# Patient Record
Sex: Female | Born: 1985 | Race: Black or African American | Hispanic: No | Marital: Married | Smoking: Never smoker
Health system: Southern US, Community
[De-identification: ages and names within clinical notes are randomized; demographics above are authoritative.]

## PROBLEM LIST (undated history)

## (undated) ENCOUNTER — Inpatient Hospital Stay (HOSPITAL_COMMUNITY): Payer: Self-pay

## (undated) DIAGNOSIS — E119 Type 2 diabetes mellitus without complications: Secondary | ICD-10-CM

## (undated) DIAGNOSIS — E559 Vitamin D deficiency, unspecified: Secondary | ICD-10-CM

## (undated) DIAGNOSIS — Z5189 Encounter for other specified aftercare: Secondary | ICD-10-CM

## (undated) DIAGNOSIS — R87629 Unspecified abnormal cytological findings in specimens from vagina: Secondary | ICD-10-CM

## (undated) DIAGNOSIS — K5090 Crohn's disease, unspecified, without complications: Secondary | ICD-10-CM

## (undated) HISTORY — PX: BUNIONECTOMY: SHX129

## (undated) HISTORY — DX: Encounter for other specified aftercare: Z51.89

## (undated) HISTORY — DX: Crohn's disease, unspecified, without complications: K50.90

## (undated) HISTORY — DX: Type 2 diabetes mellitus without complications: E11.9

## (undated) HISTORY — DX: Unspecified abnormal cytological findings in specimens from vagina: R87.629

## (undated) HISTORY — DX: Vitamin D deficiency, unspecified: E55.9

---
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
## 2004-04-01 ENCOUNTER — Encounter: Admission: RE | Admit: 2004-04-01 | Discharge: 2004-04-01 | Payer: Self-pay | Admitting: *Deleted

## 2005-07-12 ENCOUNTER — Inpatient Hospital Stay (HOSPITAL_COMMUNITY): Admission: AD | Admit: 2005-07-12 | Discharge: 2005-07-17 | Payer: Self-pay | Admitting: Gastroenterology

## 2005-07-12 ENCOUNTER — Encounter: Admission: RE | Admit: 2005-07-12 | Discharge: 2005-07-12 | Payer: Self-pay | Admitting: Gastroenterology

## 2007-11-25 ENCOUNTER — Encounter: Admission: RE | Admit: 2007-11-25 | Discharge: 2007-11-25 | Payer: Self-pay | Admitting: Gastroenterology

## 2008-01-26 ENCOUNTER — Emergency Department (HOSPITAL_COMMUNITY): Admission: EM | Admit: 2008-01-26 | Discharge: 2008-01-26 | Payer: Self-pay | Admitting: Emergency Medicine

## 2008-04-09 ENCOUNTER — Encounter (HOSPITAL_COMMUNITY): Admission: RE | Admit: 2008-04-09 | Discharge: 2008-04-10 | Payer: Self-pay | Admitting: Gastroenterology

## 2008-06-30 ENCOUNTER — Emergency Department (HOSPITAL_COMMUNITY): Admission: EM | Admit: 2008-06-30 | Discharge: 2008-06-30 | Payer: Self-pay | Admitting: Emergency Medicine

## 2008-07-02 ENCOUNTER — Inpatient Hospital Stay (HOSPITAL_COMMUNITY): Admission: AD | Admit: 2008-07-02 | Discharge: 2008-07-06 | Payer: Self-pay | Admitting: Gastroenterology

## 2008-07-13 ENCOUNTER — Emergency Department (HOSPITAL_COMMUNITY): Admission: EM | Admit: 2008-07-13 | Discharge: 2008-07-13 | Payer: Self-pay | Admitting: Emergency Medicine

## 2008-10-22 ENCOUNTER — Encounter: Admission: RE | Admit: 2008-10-22 | Discharge: 2008-10-22 | Payer: Self-pay | Admitting: Gastroenterology

## 2010-06-21 LAB — COMPREHENSIVE METABOLIC PANEL
Albumin: 3.2 g/dL — ABNORMAL LOW (ref 3.5–5.2)
BUN: 7 mg/dL (ref 6–23)
Calcium: 8.8 mg/dL (ref 8.4–10.5)
Creatinine, Ser: 0.68 mg/dL (ref 0.4–1.2)
Total Protein: 7.2 g/dL (ref 6.0–8.3)

## 2010-06-22 LAB — DIFFERENTIAL
Basophils Absolute: 0 10*3/uL (ref 0.0–0.1)
Basophils Absolute: 0 10*3/uL (ref 0.0–0.1)
Basophils Absolute: 0 10*3/uL (ref 0.0–0.1)
Basophils Relative: 0 % (ref 0–1)
Basophils Relative: 0 % (ref 0–1)
Basophils Relative: 0 % (ref 0–1)
Basophils Relative: 0 % (ref 0–1)
Basophils Relative: 0 % (ref 0–1)
Eosinophils Absolute: 0 10*3/uL (ref 0.0–0.7)
Eosinophils Absolute: 0 10*3/uL (ref 0.0–0.7)
Eosinophils Absolute: 0 10*3/uL (ref 0.0–0.7)
Eosinophils Absolute: 0 10*3/uL (ref 0.0–0.7)
Eosinophils Absolute: 0 10*3/uL (ref 0.0–0.7)
Eosinophils Absolute: 0 10*3/uL (ref 0.0–0.7)
Eosinophils Relative: 0 % (ref 0–5)
Eosinophils Relative: 0 % (ref 0–5)
Eosinophils Relative: 0 % (ref 0–5)
Eosinophils Relative: 0 % (ref 0–5)
Lymphocytes Relative: 12 % (ref 12–46)
Lymphocytes Relative: 16 % (ref 12–46)
Lymphocytes Relative: 25 % (ref 12–46)
Lymphs Abs: 1.3 10*3/uL (ref 0.7–4.0)
Lymphs Abs: 1.3 10*3/uL (ref 0.7–4.0)
Monocytes Absolute: 0.1 10*3/uL (ref 0.1–1.0)
Monocytes Absolute: 0.5 10*3/uL (ref 0.1–1.0)
Monocytes Absolute: 1.3 10*3/uL — ABNORMAL HIGH (ref 0.1–1.0)
Monocytes Relative: 3 % (ref 3–12)
Monocytes Relative: 5 % (ref 3–12)
Monocytes Relative: 7 % (ref 3–12)
Monocytes Relative: 8 % (ref 3–12)
Neutro Abs: 7.1 10*3/uL (ref 1.7–7.7)
Neutrophils Relative %: 63 % (ref 43–77)
Neutrophils Relative %: 78 % — ABNORMAL HIGH (ref 43–77)
Neutrophils Relative %: 82 % — ABNORMAL HIGH (ref 43–77)

## 2010-06-22 LAB — COMPREHENSIVE METABOLIC PANEL
ALT: 10 U/L (ref 0–35)
ALT: 13 U/L (ref 0–35)
AST: 19 U/L (ref 0–37)
Albumin: 2.9 g/dL — ABNORMAL LOW (ref 3.5–5.2)
Alkaline Phosphatase: 52 U/L (ref 39–117)
Alkaline Phosphatase: 63 U/L (ref 39–117)
BUN: 3 mg/dL — ABNORMAL LOW (ref 6–23)
BUN: 7 mg/dL (ref 6–23)
CO2: 20 mEq/L (ref 19–32)
CO2: 22 mEq/L (ref 19–32)
Calcium: 8.3 mg/dL — ABNORMAL LOW (ref 8.4–10.5)
Creatinine, Ser: 1.2 mg/dL (ref 0.4–1.2)
GFR calc Af Amer: >60 mL/min (ref >60)
GFR calc non Af Amer: >60 mL/min (ref >60)
Glucose, Bld: 153 mg/dL — ABNORMAL HIGH (ref 70–99)
Potassium: 3.2 mEq/L — ABNORMAL LOW (ref 3.5–5.1)
Potassium: 4.5 mEq/L (ref 3.5–5.1)
Sodium: 139 mEq/L (ref 135–145)
Total Bilirubin: 0.5 mg/dL (ref 0.3–1.2)
Total Protein: 5.6 g/dL — ABNORMAL LOW (ref 6.0–8.3)
Total Protein: 6.6 g/dL (ref 6.0–8.3)

## 2010-06-22 LAB — CBC
HCT: 31.7 % — ABNORMAL LOW (ref 36.0–46.0)
HCT: 33.1 % — ABNORMAL LOW (ref 36.0–46.0)
HCT: 33.3 % — ABNORMAL LOW (ref 36.0–46.0)
HCT: 45.5 % (ref 36.0–46.0)
Hemoglobin: 10.4 g/dL — ABNORMAL LOW (ref 12.0–15.0)
Hemoglobin: 11.1 g/dL — ABNORMAL LOW (ref 12.0–15.0)
Hemoglobin: 12.1 g/dL (ref 12.0–15.0)
MCHC: 33.2 g/dL (ref 30.0–36.0)
MCHC: 33.3 g/dL (ref 30.0–36.0)
MCV: 81.1 fL (ref 78.0–100.0)
MCV: 82.1 fL (ref 78.0–100.0)
MCV: 82.4 fL (ref 78.0–100.0)
Platelets: 218 10*3/uL (ref 150–400)
Platelets: 256 10*3/uL (ref 150–400)
Platelets: 283 10*3/uL (ref 150–400)
RBC: 3.87 MIL/uL (ref 3.87–5.11)
RBC: 4.03 MIL/uL (ref 3.87–5.11)
RBC: 4.04 MIL/uL (ref 3.87–5.11)
RBC: 4.11 MIL/uL (ref 3.87–5.11)
RDW: 20.1 % — ABNORMAL HIGH (ref 11.5–15.5)
RDW: 20.3 % — ABNORMAL HIGH (ref 11.5–15.5)
RDW: 20.5 % — ABNORMAL HIGH (ref 11.5–15.5)
RDW: 20.6 % — ABNORMAL HIGH (ref 11.5–15.5)
WBC: 10.6 10*3/uL — ABNORMAL HIGH (ref 4.0–10.5)
WBC: 10.9 10*3/uL — ABNORMAL HIGH (ref 4.0–10.5)
WBC: 6.4 10*3/uL (ref 4.0–10.5)

## 2010-06-22 LAB — BASIC METABOLIC PANEL
BUN: 6 mg/dL (ref 6–23)
BUN: 6 mg/dL (ref 6–23)
BUN: 7 mg/dL (ref 6–23)
CO2: 20 mEq/L (ref 19–32)
Calcium: 8.2 mg/dL — ABNORMAL LOW (ref 8.4–10.5)
Chloride: 115 mEq/L — ABNORMAL HIGH (ref 96–112)
Chloride: 115 mEq/L — ABNORMAL HIGH (ref 96–112)
Creatinine, Ser: 0.9 mg/dL (ref 0.4–1.2)
Creatinine, Ser: 0.9 mg/dL (ref 0.4–1.2)
GFR calc Af Amer: >60 mL/min (ref >60)
GFR calc Af Amer: >60 mL/min (ref >60)
GFR calc non Af Amer: 56 mL/min — ABNORMAL LOW (ref >60)
Glucose, Bld: 99 mg/dL (ref 70–99)
Potassium: 3.6 mEq/L (ref 3.5–5.1)
Potassium: 4 mEq/L (ref 3.5–5.1)
Sodium: 140 mEq/L (ref 135–145)

## 2010-06-22 LAB — URINALYSIS, ROUTINE W REFLEX MICROSCOPIC
Glucose, UA: NEGATIVE mg/dL
Hgb urine dipstick: NEGATIVE
Specific Gravity, Urine: 1.021 (ref 1.005–1.030)
Urobilinogen, UA: 0.2 mg/dL (ref 0.0–1.0)

## 2010-06-22 LAB — LIPASE, BLOOD
Lipase: 417 U/L — ABNORMAL HIGH (ref 11–59)
Lipase: 611 U/L — ABNORMAL HIGH (ref 11–59)

## 2010-06-22 LAB — HEMOCCULT GUIAC POC 1CARD (OFFICE): Fecal Occult Bld: POSITIVE

## 2010-06-22 LAB — AMYLASE
Amylase: 443 U/L — ABNORMAL HIGH (ref 27–131)
Amylase: 482 U/L — ABNORMAL HIGH (ref 27–131)

## 2010-06-22 LAB — URINE MICROSCOPIC-ADD ON

## 2010-06-27 LAB — CROSSMATCH: ABO/RH(D): B POS

## 2010-07-26 NOTE — Consult Note (Signed)
NAME:  Marissa Adams, ANTILLA NO.:  [id number]   MEDICAL RECORD NO.:  [id number]          PATIENT TYPE:  INP   LOCATION:  1318                         FACILITY:  [facility]   PHYSICIAN:  Chalmers Guest, M.D.     DATE OF BIRTH:  June 25, 1985   DATE OF CONSULTATION:  07/02/2008  DATE OF DISCHARGE:                                 CONSULTATION   REFERRING PHYSICIAN:  Shirley Friar, MD.   REASON FOR CONSULTATION:  I was requested by Dr. Bosie Clos to see this  patient because of a red, painful eye.   The patient is a 25 year old African American female who was admitted to  the hospital today with Crohn's disease and she complained of a painful  red eye.  The patient's past ocular history is remarkable for previous  recurrent corneal erosions previously treated in [place].  The patient  stated that this redness had occurred within the last 1 or 2 weeks and  tended to go and come away.   EXAMINATION:  The patient's visual acuity is 20/40+ right eye, 20/40+  left eye without correction.  The patient's pupils were 3.5 mm, round  and reactive.  Negative Page Spiro.  Motility was full.  External lid  exam was normal.  The slit lamp examination revealed right eye with  conjunctiva with a ciliary flush.  The conjunctiva left eye was normal.  The cornea revealed inferior epithelial defect right eye.  The anterior  chamber was deep with a +1 cell and flare right eye.  The iris was  brown.  The lens was clear both eyes.  The left eye was deep and quiet.  The lens was clear both eyes as noted.  The retina was flat with a  normal macula and normal foveal reflex.  The optic nerve was pink with a  sharp disc margin.  The vitreous was clear with no cells.   IMPRESSION:  1. Recurrent corneal erosion right eye.  2. Iridocyclitis or iritis, right eye.   RECOMMENDATIONS:  The patient will start TobraDex ointment 3 times  daily.  She will also be dilated with Cyclopentolate for at least 1  week.  The patient should follow up in my office for a followup  examination after she is discharged from the hospital.  We will continue  the TobraDex until she returns for the followup in approximately not  longer than 2 weeks.      Chalmers Guest, M.D.  Electronically Signed     RW/MEDQ  D:  07/02/2008  T:  07/02/2008  Job:  [id number]

## 2010-07-26 NOTE — H&P (Signed)
Marissa Adams, Marissa Adams             ACCOUNT NO.:  [id number]   MEDICAL RECORD NO.:  [id number]          PATIENT TYPE:  INP   LOCATION:  1318                         FACILITY:  [facility]   PHYSICIAN:  Shirley Friar, MDDATE OF BIRTH:  December 22, 1985   DATE OF ADMISSION:  07/02/2008  DATE OF DISCHARGE:                              HISTORY & PHYSICAL   ADMISSION DIAGNOSES:  1. Crohn's flare  2. Fever.  3. Diarrhea.  4. Abdominal pain.   HISTORY OF PRESENT ILLNESS:  Marissa Adams is a 25 year old black female  who was seen in the office today as an urgent work-in due to worsening  abdominal pain, fevers, chills, diarrhea over the past week in a setting  of known Crohn's ileocolitis.  She was seen in the emergency room  approximately 48 hours ago for the same problems and had a CAT scan  which showed diffuse colitis.  A C. diff toxin ordered by our office was  negative.  She reports subjective fevers and chills.  Abdominal pain is  intermittent, crampy in the lower quadrants.  She has been unable to  keep any food or liquids down due to the nausea and vomiting.  She was  switched from Asacol to Lialda last week because of compliance issues  with the Asacol. She also reports a 1-day history of right eye pain,  redness and watering and thinks it is a corneal tear that she thinks  she had in the past.  In the office today she is febrile at 102.9. Heart  rate is in 140's.   PAST MEDICAL HISTORY:  Crohn's ileocolitis.   MEDICINES:  1. Lialda 4.8 grams p.o. daily.  2. Phenergan p.r.n.  3. Oxycodone p.r.n. (recently obtained from the emergency room visit).  4. Excedrin Migraine p.r.n.   Also has past medical history of herpes.   ALLERGIES:  No known drug allergies.   FAMILY HISTORY:  Denies family history of colon cancer, mother has had  ulcers.   SOCIAL HISTORY:  Denies cigarettes, alcohol or drugs.   REVIEW OF SYSTEMS:  Negative from a GI standpoint except as stated  above.   PHYSICAL EXAM:  VITAL SIGNS:  Temperature 102.9, pulse 140, blood  pressure sitting 90/48, lying down 90/60. Heart rate in the 140's lying  down or sitting  GENERAL:  Alert, mild acute distress.  HEENT: Sclerae right eye erythema and watery. Left eye no erythema.  HEART:  Tachycardiac, regular rhythm.  LUNGS: Clear to auscultation bilaterally.  ABDOMEN:  Right lower quadrant tenderness with guarding otherwise  nontender. Soft, nondistended, positive bowel sounds  EXTREMITIES:  No edema.   IMPRESSION:  A 25 year old black female with known Crohn's disease  presenting with what sounds like a Crohn's flare.  She also still could  have pseudomembranous colitis despite a negative Clostridium difficile  toxin.  She will be admitted for supportive care, IV Flagyl, IV  steroids, and IV fluids.  Will need to obtain an ophthalmology consult  to evaluate her right eye and see if this redness is due to Crohn's  disease.      Shirley Friar, MD  Electronically Signed     VCS/MEDQ  D:  07/02/2008  T:  07/02/2008  Job:  [id number]   cc:   Duncan Dull, M.D.  Fax: [phone]

## 2010-07-26 NOTE — Discharge Summary (Signed)
NAME:  Marissa Adams, Marissa Adams NO.:  [id number]   MEDICAL RECORD NO.:  [id number]          PATIENT TYPE:  INP   LOCATION:  1318                         FACILITY:  [facility]   PHYSICIAN:  Shirley Friar, MDDATE OF BIRTH:  1985/06/14   DATE OF ADMISSION:  07/02/2008  DATE OF DISCHARGE:  07/06/2008                               DISCHARGE SUMMARY   [name] was admitted to [facility] on July 02, 2008.  She  is being discharged on July 06, 2008.   ADMIT DIAGNOSES:  1. Crohn's flare.  2. Right eye pain and redness.   DISCHARGE DIAGNOSES:  1. Crohn's flare, resolving.  2. Elevation in pancreatic enzymes asymptomatic.  3. Recurrent corneal erosion in right eye.  4. Iridocyclitis right eye.   CONSULT:  Dr. Chalmers Guest on April 22.   PERTINENT LABORATORIES:  [name] is being discharged with the  following lab result:  Hemoglobin 10.9, hematocrit 33.3, white count  10.9, platelets 256,000.  BMET remarkable for chloride of 116 and a  glucose of 124.  Amylase 443, lipase 417.  These labs were taken the  morning of April 22.   HISTORY AND BRIEF HOSPITAL COURSE:  [name] is a 25 year old female  who was seen in the [facility] as an urgent work-in  on April 22 due to worsening abdominal pain, fever, chills and diarrhea.  She has a history of Crohn's ileocolitis.  She was seen in the emergency  room on the 20th for the same problems.  She had a CT scan that showed  diffuse colitis and normal pancreas.  A C. difficile toxin was ordered  by [facility], and it was negative.  One week prior to her  admission she switched from Asacol to Lialda to improve compliance.   She also reported a 1-day history of right eye pain, redness, and watery  that she thinks might be a corneal tear because she has had it in the  past.  In the office she had a fever of 102.9 and a heart rate of 140.  Over the course of her brief hospital stay, she  improved quickly.  Her  bowel movements decreased in number.  Her abdominal pain resolved, and  she was able to tolerate clear liquids.  Further she was seen by Dr.  Harlon Flor on April 22.  He diagnosed her with corneal erosion and iritis  , and prescribed medications for these.  During the course of her  hospital stay it was noticed incidentally that her amylase and lipase  began to rise, and over the course of 4 days rose to the 400s.  However,  she was having no abdominal pain, no vomiting.  Her bowel movements were  improving and no other symptoms to suggest pancreatitis. Further she had  a normal pancreas on her CT exam done April 20.  She is going to be  discharged to home this morning in good condition.  Her only complaint  is that she is having bowel movements 3 times a day instead of 2 times a  day, but she is able to  tolerate a diet.  She has no pain.  She looked  well, and she is requesting discharge.   DISCHARGE MEDICATIONS:  1. Cyclopentolate 1 drop in her right eye 4 times a day at Dr.      Hosie Poisson recommendation.  2. Also TobraDex ointment.  Apply to right eye 3 times a day for      another 7 days also from Dr. Harlon Flor.  3. She will be discharged on Flagyl 500 mg p.o. t.i.d. to finish out      her course of antibiotics.  4. She will be discharged on prednisone 40 mg 1 tablet once a day      until she sees Dr. Bosie Clos.  5. She also has Lialda 1.2 g 4 tablets each morning, promethazine 25      mg 1 tablet q.6 hours p.r.n. nausea, and oxycodone APAP 5/325 one      tablet every 4-6 hours as needed.   DISCHARGE INSTRUCTIONS:  The patient has been requested to eat a low-  residue and low-fat diet.  She has also been requested to call [facility] if she has any increase in abdominal pain, bleeding  with her stools or recurrence of fever before she sees Dr. Bosie Clos.  She understands her instructions for her eye drops and eye ointment  medications.  She is to  call Dr. Hosie Poisson office at discharge to set  up an office appointment for 1 week from today.  She has an appointment  for labs at [facility] this coming Thursday, a CMET, CBC,  amylase, and lipase, and she has a follow-up office appointment with Dr.  Charlott Rakes on Monday, May 3.      Stephani Police, [place]      Shirley Friar, MD  Electronically Signed    MLY/MEDQ  D:  07/06/2008  T:  07/06/2008  Job:  [id number]   cc:   Chalmers Guest, M.D.  Fax: [phone]

## 2010-07-29 NOTE — H&P (Signed)
Marissa Adams, Marissa Adams NO.:  [id number]   MEDICAL RECORD NO.:  [id number]          PATIENT TYPE:  INP   LOCATION:  3035                         FACILITY:  [facility]   PHYSICIAN:  Shirley Friar, MDDATE OF BIRTH:  06/09/1985   DATE OF ADMISSION:  07/12/2005  DATE OF DISCHARGE:                                HISTORY & PHYSICAL   CHIEF COMPLAINT:  Nausea, weight loss, history of Crohn's.   HISTORY OF PRESENT ILLNESS:  [name] is a 25 year old black female  diagnosed with Crohn's ileocolitis in 2004, who has been managed on Entocort  and Pentasa. She came to see me in the office today complaining of a 2-week  history of nausea, poor appetite, weight loss and weakness. She was  hypotensive and had a low-grade fever in the office this morning and  complained of severe weakness for the past couple of weeks. She was  accompanied by her mother who brought her from her college in [place] to  bring her back here to have her evaluated. [name] was seen at an urgent  care in [place] and was found to be anemic and was instructed to return for  further evaluation but at that time her mother went to [facility] to pick her  up and brought her back here. Since her office visit she was found not to be  orthostatic but was hypotensive with blood pressure 86/64. She had stat labs  done which showed a hemoglobin of 9.7, hematocrit of 30, with a normal white  blood cell count of 6.1 thousand, platelet count was elevated at 496,000 and  a sedimentation rate was 58.  Her liver function tests were normal and her  electrolytes were otherwise normal. Her albumin was low at 3.0.  She had a  CAT scan done of her abdomen which was done with IV contrast but she refused  to drink the oral contrast. The CAT scan showed thickening of the right side  of her colon up to the transverse colon concerning for Crohn's colitis. No  obstructions were seen in her intestines and no involvement of her  small  intestine was noted. But again, this was ruled out without any oral  contrast.  After discussing these findings with her mother, her mother  states that she will not drink any liquids except for sips and feels very  fatigued. The patient also had an episode of epistaxis this afternoon and  has had intermittent epistaxis over the past several days. These episodes of  epistaxis were not mentioned during her evaluation by me this morning.  Patient reports non compliance with Pentasa and Entocort while in [place] at  the college.   PAST MEDICAL HISTORY:  History of Crohn's ileocolitis.   MEDICATIONS:  Pentasa 250 mg, Fortaz x4 daily, Entocort 9 daily (not  compliant).   ALLERGIES:  No known drug allergies.   FAMILY HISTORY:  Noncontributory.   SOCIAL HISTORY:  Currently in school in [place], otherwise negative.   REVIEW OF SYSTEMS:  Denies abdominal pain, rectal bleeding, hematochezia,  vomiting. Positive subjective fevers and chills. All other negative except  as stated in the HPI.   PHYSICAL EXAMINATION:  VITAL SIGNS:  Temperature 100.3, pulse 136, blood  pressure 86/64, weight 99 pounds (down 12 pounds since August, 2006).  GENERAL:  Thin, alert, in no acute distress.  HEENT:  Anicteric sclerae.  CHEST:  Clear to auscultation bilaterally.  CARDIOVASCULAR:  Tachycardic, regular rhythm. No murmurs.  ABDOMEN:  Thin, soft, nontender, nondistended. Active bowel sounds, no  masses palpated, no rebound and no guarding.  EXTREMITIES:  No edema.   LABORATORY DATA:  White blood cell count 6100, hemoglobin 9.7, hematocrit  30.6, platelet count 496,000, sedimentation rate 58. Glucose 98, BUN 9,  creatinine 1.2, sodium 136, potassium 4.3, chloride 103, CO2 25, calcium  8.7, total protein 7.8, albumin 3.0, total bilirubin 1.1, ALP 51, AST 37,  ALT 24.   IMPRESSION:  25 year old black female with nausea, weight loss, fatigue and  active Crohn's disease likely all secondary to Crohn's  exacerbation. Made  worse by her noncompliance with her mesalamine and Entocort. Due to  patient's failure to thrive, anemia and Crohn's colitis, I feel that short-  term hospitalization will be in her best interest in order to improve her  hydration as well as calm her Crohn's disease down. Will plan on aggressive  IV hydration, IV steroids, and change her Pentasa to Asacol which may help  her with compliance. Will put patient on a clear liquid diet and consider  advancing as tolerated. Will hold off on transfusion or blood products today  unless her blood count continues to fall, or remain at the current level.  I  stressed to patient in the office about being compliant with her Crohn's  disease medicines and this will have to be continually reinforced by her  mother as well as by her physicians. However, I strongly encouraged her to  educate herself on Crohn's disease and hopefully this episode will be short  lived so she can get back to her college work.  She may need to change to  Lialda as an outpatient instead of Asacol to further improve compliance with  her.   PLAN:  1.  Admit to a regular hospital bed.  2.  Clear liquid diet.  3.  Solu-Medrol 60 mg IV q.8 hours.  4.  Intravenous fluids of normal saline at 100 cc/Hr.  5.  Start Asacol 800 mg p.o. t.i.d.  6.  Advance diet as tolerated.   Admission plans discussed with Dr. Vida Rigger.      Shirley Friar, MD  Electronically Signed     VCS/MEDQ  D:  07/12/2005  T:  07/12/2005  Job:  [id number]

## 2010-07-29 NOTE — Discharge Summary (Signed)
NAME:  Marissa Adams, Marissa Adams             ACCOUNT NO.:  [id number]   MEDICAL RECORD NO.:  [id number]          PATIENT TYPE:  INP   LOCATION:  3035                         FACILITY:  [facility]   PHYSICIAN:  John C. Madilyn Fireman, M.D.    DATE OF BIRTH:  [birth year]   DATE OF ADMISSION:  07/12/2005  DATE OF DISCHARGE:  07/17/2005                                 DISCHARGE SUMMARY   HISTORY OF PRESENT ILLNESS:  The patient is a 25 year old, black female with  Crohn's ileocolitis who was admitted with nausea, anorexia, weight loss,  weakness with lesser degrees of abdominal cramps and diarrhea.  She was  afebrile and had a normal white blood cell count with hemoglobin of 9.7 with  elevated platelet count of 496,000, slight hypotension and a sedimentation  rate of 58.  CT scan showed thickening of the right side of her colon and  she was admitted for further therapy.  For details please see admission  History and Physical.   HOSPITAL COURSE:  The patient was begun on IV Solu-Medrol and Pentasa.  She  continued to feel weak and at some point quiet and minimally verbal  throughout the first two hospital days.  Her hemoglobin gradually dropped  down to 7.3 and it was decided to transfuse her 2 units of packed red blood  cells.  This was done on May 5.  Subsequent hemoglobin was 10.2 and on May  7, it was 10.6.  She seemed to have improved significantly after getting  blood.  On May 6, her Solu-Medrol was switched over the oral prednisone.  Her diet advanced to a low-residue diet.  On May 7, she was felt ready for  discharge.   DISCHARGE MEDICATIONS:  1.  Prednisone 40 mg a day.  2.  Pentasa 1 g t.i.d.   DISCHARGE DIAGNOSIS:  Crohn's ileocolitis.   FOLLOW UP:  Follow up with Dr. Shirley Friar in 2 weeks.           ______________________________  Everardo All Madilyn Fireman, M.D.     JCH/MEDQ  D:  07/17/2005  T:  07/17/2005  Job:  [id number]

## 2011-12-08 DIAGNOSIS — K5090 Crohn's disease, unspecified, without complications: Secondary | ICD-10-CM | POA: Insufficient documentation

## 2012-06-28 DIAGNOSIS — T380X5A Adverse effect of glucocorticoids and synthetic analogues, initial encounter: Secondary | ICD-10-CM

## 2012-06-28 DIAGNOSIS — E099 Drug or chemical induced diabetes mellitus without complications: Secondary | ICD-10-CM | POA: Insufficient documentation

## 2014-03-11 ENCOUNTER — Other Ambulatory Visit (INDEPENDENT_AMBULATORY_CARE_PROVIDER_SITE_OTHER): Payer: BC Managed Care – PPO

## 2014-03-11 ENCOUNTER — Ambulatory Visit (INDEPENDENT_AMBULATORY_CARE_PROVIDER_SITE_OTHER): Payer: BC Managed Care – PPO | Admitting: Internal Medicine

## 2014-03-11 ENCOUNTER — Encounter: Payer: Self-pay | Admitting: Internal Medicine

## 2014-03-11 VITALS — BP 118/76 | HR 71 | Temp 98.0°F | Resp 12 | Ht 64.0 in | Wt 148.0 lb

## 2014-03-11 DIAGNOSIS — K50919 Crohn's disease, unspecified, with unspecified complications: Secondary | ICD-10-CM

## 2014-03-11 DIAGNOSIS — Z299 Encounter for prophylactic measures, unspecified: Secondary | ICD-10-CM

## 2014-03-11 DIAGNOSIS — R7301 Impaired fasting glucose: Secondary | ICD-10-CM

## 2014-03-11 DIAGNOSIS — Z23 Encounter for immunization: Secondary | ICD-10-CM

## 2014-03-11 DIAGNOSIS — Z418 Encounter for other procedures for purposes other than remedying health state: Secondary | ICD-10-CM

## 2014-03-11 LAB — CBC
HCT: 42.3 % (ref 36.0–46.0)
HEMOGLOBIN: 13.7 g/dL (ref 12.0–15.0)
MCHC: 32.3 g/dL (ref 30.0–36.0)
MCV: 90.5 fl (ref 78.0–100.0)
Platelets: 280 10*3/uL (ref 150.0–400.0)
RBC: 4.68 Mil/uL (ref 3.87–5.11)
RDW: 12.9 % (ref 11.5–15.5)
WBC: 6.8 10*3/uL (ref 4.0–10.5)

## 2014-03-11 LAB — COMPREHENSIVE METABOLIC PANEL
ALBUMIN: 3.9 g/dL (ref 3.5–5.2)
ALK PHOS: 62 U/L (ref 39–117)
ALT: 9 U/L (ref 0–35)
AST: 15 U/L (ref 0–37)
BUN: 10 mg/dL (ref 6–23)
CALCIUM: 9.2 mg/dL (ref 8.4–10.5)
CO2: 26 mEq/L (ref 19–32)
CREATININE: 0.9 mg/dL (ref 0.4–1.2)
Chloride: 107 mEq/L (ref 96–112)
GFR: 94.89 mL/min (ref >60.00)
GLUCOSE: 78 mg/dL (ref 70–99)
POTASSIUM: 4.5 meq/L (ref 3.5–5.1)
Sodium: 141 mEq/L (ref 135–145)
Total Bilirubin: 1.1 mg/dL (ref 0.2–1.2)
Total Protein: 7.6 g/dL (ref 6.0–8.3)

## 2014-03-11 LAB — LIPID PANEL
CHOLESTEROL: 123 mg/dL (ref 0–200)
HDL: 50.2 mg/dL (ref >39.00)
LDL Cholesterol: 64 mg/dL (ref 0–99)
NonHDL: 72.8
TRIGLYCERIDES: 45 mg/dL (ref 0.0–149.0)
Total CHOL/HDL Ratio: 2
VLDL: 9 mg/dL (ref 0.0–40.0)

## 2014-03-11 LAB — FERRITIN: Ferritin: 28.6 ng/mL (ref 10.0–291.0)

## 2014-03-11 LAB — HEMOGLOBIN A1C: Hgb A1c MFr Bld: 4.7 % (ref 4.6–6.5)

## 2014-03-11 NOTE — Patient Instructions (Signed)
We have given you a tetanus shot today. We will check your blood work for your kidneys, liver, sugars, blood counts, iron levels.   It may be worthwhile to start taking a multivitamin everyday.  We will try to get you into the GI office upstairs so you have someone closer by to watch your crohn's disease.   Health Maintenance Adopting a healthy lifestyle and getting preventive care can go a long way to promote health and wellness. Talk with your health care provider about what schedule of regular examinations is right for you. This is a good chance for you to check in with your provider about disease prevention and staying healthy. In between checkups, there are plenty of things you can do on your own. Experts have done a lot of research about which lifestyle changes and preventive measures are most likely to keep you healthy. Ask your health care provider for more information. WEIGHT AND DIET  Eat a healthy diet  Be sure to include plenty of vegetables, fruits, low-fat dairy products, and lean protein.  Do not eat a lot of foods high in solid fats, added sugars, or salt.  Get regular exercise. This is one of the most important things you can do for your health.  Most adults should exercise for at least 150 minutes each week. The exercise should increase your heart rate and make you sweat (moderate-intensity exercise).  Most adults should also do strengthening exercises at least twice a week. This is in addition to the moderate-intensity exercise.  Maintain a healthy weight  Body mass index (BMI) is a measurement that can be used to identify possible weight problems. It estimates body fat based on height and weight. Your health care provider can help determine your BMI and help you achieve or maintain a healthy weight.  For females 51 years of age and older:   A BMI below 18.5 is considered underweight.  A BMI of 18.5 to 24.9 is normal.  A BMI of 25 to 29.9 is considered  overweight.  A BMI of 30 and above is considered obese.  Watch levels of cholesterol and blood lipids  You should start having your blood tested for lipids and cholesterol at 29 years of age, then have this test every 5 years.  You may need to have your cholesterol levels checked more often if:  Your lipid or cholesterol levels are high.  You are older than 29 years of age.  You are at high risk for heart disease.  CANCER SCREENING   Lung Cancer  Lung cancer screening is recommended for adults 45-60 years old who are at high risk for lung cancer because of a history of smoking.  A yearly low-dose CT scan of the lungs is recommended for people who:  Currently smoke.  Have quit within the past 15 years.  Have at least a 30-pack-year history of smoking. A pack year is smoking an average of one pack of cigarettes a day for 1 year.  Yearly screening should continue until it has been 15 years since you quit.  Yearly screening should stop if you develop a health problem that would prevent you from having lung cancer treatment.  Breast Cancer  Practice breast self-awareness. This means understanding how your breasts normally appear and feel.  It also means doing regular breast self-exams. Let your health care provider know about any changes, no matter how small.  If you are in your 20s or 30s, you should have a clinical breast exam (  care provider every 1-3 years as part of a regular health exam.  If you are 40 or older, have a CBE every year. Also consider having a breast X-ray (mammogram) every year.  If you have a family history of breast cancer, talk to your health care provider about genetic screening.  If you are at high risk for breast cancer, talk to your health care provider about having an MRI and a mammogram every year.  Breast cancer gene (BRCA) assessment is recommended for women who have family members with BRCA-related cancers. BRCA-related  cancers include:  Breast.  Ovarian.  Tubal.  Peritoneal cancers.  Results of the assessment will determine the need for genetic counseling and BRCA1 and BRCA2 testing. Cervical Cancer Routine pelvic examinations to screen for cervical cancer are no longer recommended for nonpregnant women who are considered low risk for cancer of the pelvic organs (ovaries, uterus, and vagina) and who do not have symptoms. A pelvic examination may be necessary if you have symptoms including those associated with pelvic infections. Ask your health care provider if a screening pelvic exam is right for you.   The Pap test is the screening test for cervical cancer for women who are considered at risk.  If you had a hysterectomy for a problem that was not cancer or a condition that could lead to cancer, then you no longer need Pap tests.  If you are older than 65 years, and you have had normal Pap tests for the past 10 years, you no longer need to have Pap tests.  If you have had past treatment for cervical cancer or a condition that could lead to cancer, you need Pap tests and screening for cancer for at least 20 years after your treatment.  If you no longer get a Pap test, assess your risk factors if they change (such as having a new sexual partner). This can affect whether you should start being screened again.  Some women have medical problems that increase their chance of getting cervical cancer. If this is the case for you, your health care provider may recommend more frequent screening and Pap tests.  The human papillomavirus (HPV) test is another test that may be used for cervical cancer screening. The HPV test looks for the virus that can cause cell changes in the cervix. The cells collected during the Pap test can be tested for HPV.  The HPV test can be used to screen women 30 years of age and older. Getting tested for HPV can extend the interval between normal Pap tests from three to five  years.  An HPV test also should be used to screen women of any age who have unclear Pap test results.  After 30 years of age, women should have HPV testing as often as Pap tests.  Colorectal Cancer  This type of cancer can be detected and often prevented.  Routine colorectal cancer screening usually begins at 29 years of age and continues through 29 years of age.  Your health care provider may recommend screening at an earlier age if you have risk factors for colon cancer.  Your health care provider may also recommend using home test kits to check for hidden blood in the stool.  A small camera at the end of a tube can be used to examine your colon directly (sigmoidoscopy or colonoscopy). This is done to check for the earliest forms of colorectal cancer.  Routine screening usually begins at age 50.  Direct examination of the   Direct examination of the colon should be repeated every 5-10 years through 29 years of age. However, you may need to be screened more often if early forms of precancerous polyps or small growths are found. Skin Cancer  Check your skin from head to toe regularly.  Tell your health care provider about any new moles or changes in moles, especially if there is a change in a mole's shape or color.  Also tell your health care provider if you have a mole that is larger than the size of a pencil eraser.  Always use sunscreen. Apply sunscreen liberally and repeatedly throughout the day.  Protect yourself by wearing long sleeves, pants, a wide-brimmed hat, and sunglasses whenever you are outside. HEART DISEASE, DIABETES, AND HIGH BLOOD PRESSURE   Have your blood pressure checked at least every 1-2 years. High blood pressure causes heart disease and increases the risk of stroke.  If you are between 55 years and 79 years old, ask your health care provider if you should take aspirin to prevent strokes.  Have regular diabetes screenings. This involves taking a blood sample to check your fasting  blood sugar level.  If you are at a normal weight and have a low risk for diabetes, have this test once every three years after 29 years of age.  If you are overweight and have a high risk for diabetes, consider being tested at a younger age or more often. PREVENTING INFECTION  Hepatitis B  If you have a higher risk for hepatitis B, you should be screened for this virus. You are considered at high risk for hepatitis B if:  You were born in a country where hepatitis B is common. Ask your health care provider which countries are considered high risk.  Your parents were born in a high-risk country, and you have not been immunized against hepatitis B (hepatitis B vaccine).  You have HIV or AIDS.  You use needles to inject street drugs.  You live with someone who has hepatitis B.  You have had sex with someone who has hepatitis B.  You get hemodialysis treatment.  You take certain medicines for conditions, including cancer, organ transplantation, and autoimmune conditions. Hepatitis C  Blood testing is recommended for:  Everyone born from 1945 through 1965.  Anyone with known risk factors for hepatitis C. Sexually transmitted infections (STIs)  You should be screened for sexually transmitted infections (STIs) including gonorrhea and chlamydia if:  You are sexually active and are younger than 29 years of age.  You are older than 29 years of age and your health care provider tells you that you are at risk for this type of infection.  Your sexual activity has changed since you were last screened and you are at an increased risk for chlamydia or gonorrhea. Ask your health care provider if you are at risk.  If you do not have HIV, but are at risk, it may be recommended that you take a prescription medicine daily to prevent HIV infection. This is called pre-exposure prophylaxis (PrEP). You are considered at risk if:  You are sexually active and do not regularly use condoms or know  the HIV status of your partner(s).  You take drugs by injection.  You are sexually active with a partner who has HIV. Talk with your health care provider about whether you are at high risk of being infected with HIV. If you choose to begin PrEP, you should first be tested for HIV. You should then be   tested every 3 months for as long as you are taking PrEP.  PREGNANCY   If you are premenopausal and you may become pregnant, ask your health care provider about preconception counseling.  If you may become pregnant, take 400 to 800 micrograms (mcg) of folic acid every day.  If you want to prevent pregnancy, talk to your health care provider about birth control (contraception). OSTEOPOROSIS AND MENOPAUSE   Osteoporosis is a disease in which the bones lose minerals and strength with aging. This can result in serious bone fractures. Your risk for osteoporosis can be identified using a bone density scan.  If you are 65 years of age or older, or if you are at risk for osteoporosis and fractures, ask your health care provider if you should be screened.  Ask your health care provider whether you should take a calcium or vitamin D supplement to lower your risk for osteoporosis.  Menopause may have certain physical symptoms and risks.  Hormone replacement therapy may reduce some of these symptoms and risks. Talk to your health care provider about whether hormone replacement therapy is right for you.  HOME CARE INSTRUCTIONS   Schedule regular health, dental, and eye exams.  Stay current with your immunizations.   Do not use any tobacco products including cigarettes, chewing tobacco, or electronic cigarettes.  If you are pregnant, do not drink alcohol.  If you are breastfeeding, limit how much and how often you drink alcohol.  Limit alcohol intake to no more than 1 drink per day for nonpregnant women. One drink equals 12 ounces of beer, 5 ounces of wine, or 1 ounces of hard liquor.  Do not  use street drugs.  Do not share needles.  Ask your health care provider for help if you need support or information about quitting drugs.  Tell your health care provider if you often feel depressed.  Tell your health care provider if you have ever been abused or do not feel safe at home. Document Released: 09/12/2010 Document Revised: 07/14/2013 Document Reviewed: 01/29/2013 [employer] Patient Information 2015 [employer]. This information is not intended to replace advice given to you by your health care provider. Make sure you discuss any questions you have with your health care provider.  

## 2014-03-11 NOTE — Progress Notes (Signed)
Pre visit review using our clinic review tool, if applicable. No additional management support is needed unless otherwise documented below in the visit note. 

## 2014-03-12 DIAGNOSIS — R7301 Impaired fasting glucose: Secondary | ICD-10-CM | POA: Insufficient documentation

## 2014-03-12 NOTE — Assessment & Plan Note (Signed)
Will refer to GI, do not prescribe immunomodulators. Will check CBC, CMP. At this time unclear if she is in remission given that last colonoscopy on current medication (decreased dosing) still showed inflammation. 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

## 2014-03-12 NOTE — Assessment & Plan Note (Signed)
Given her previous episode of diabetes with steroids would classify her as high risk for progression to diabetes. Especially sensitive to her weight. Will check HgA1c today and would follow every 6-12 months.

## 2014-03-12 NOTE — Progress Notes (Signed)
   Subjective:    Patient ID: Marissa Adams, female    DOB: 1985/09/28, 28 y.o.   MRN: [id number]  HPI The patient is a 28 YO female who comes in to establish care today. She has PMH of crohn's disease which has been difficult to control. Since starting immunotherapy she has stopped having pains. 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 She had repeat colonoscopy last year which still showed inflammation and so her immunotherapy has been increased but she has not followed up with them in some time. She has been trying to get into GI in [place]. She also had a miscarriage in February and wants to make sure that her health is okay. She does have 1 healthy child which is about 3. She denies chest pains, abdominal pains, SOB, joint pain or swelling. She is not sure if she has ever had complications from her crohn's such as fistula. She is a non-smoker. She previously has been on insulin from the prednisone for her crohn's although was able to stop once her steroids came down in dosage.   Review of Systems  Constitutional: Negative for fever, activity change, appetite change, fatigue and unexpected weight change.  HENT: Negative.   Respiratory: Negative for cough, chest tightness, shortness of breath and wheezing.   Cardiovascular: Negative for chest pain, palpitations and leg swelling.  Gastrointestinal: Positive for diarrhea and blood in stool. Negative for nausea, abdominal pain, constipation and abdominal distention.       Occasional  Genitourinary: Negative.   Musculoskeletal: Negative.   Skin: Negative.   Neurological: Negative for dizziness, weakness, light-headedness and headaches.  Psychiatric/Behavioral: Negative.       Objective:   Physical Exam  Constitutional: She is oriented to person, place, and time. She appears well-developed and well-nourished. No distress.  HENT:  Head: Normocephalic and atraumatic.    Eyes: EOM are normal.  Neck: Normal range of motion.  Cardiovascular: Normal rate and regular rhythm.   Pulmonary/Chest: Effort normal and breath sounds normal. No respiratory distress. She has no wheezes. She has no rales.  Abdominal: Soft. Bowel sounds are normal. She exhibits no distension. There is no tenderness. There is no rebound.  Musculoskeletal: She exhibits no edema or tenderness.  Neurological: She is alert and oriented to person, place, and time. Coordination normal.  Skin: Skin is warm and dry.  Psychiatric: She has a normal mood and affect. Her behavior is normal.   Filed Vitals:   03/11/14 1108  BP: 118/76  Pulse: 71  Temp: 98 F (36.7 C)  TempSrc: Oral  Resp: 12  Height: 5\' 4"  (1.626 m)  Weight: 148 lb (67.132 kg)  SpO2: 99%      Assessment & Plan:

## 2014-03-28 ENCOUNTER — Ambulatory Visit (INDEPENDENT_AMBULATORY_CARE_PROVIDER_SITE_OTHER): Payer: BLUE CROSS/BLUE SHIELD | Admitting: Physician Assistant

## 2014-03-28 VITALS — BP 102/66 | HR 84 | Temp 97.8°F | Resp 19 | Ht 64.5 in | Wt 144.6 lb

## 2014-03-28 DIAGNOSIS — K50919 Crohn's disease, unspecified, with unspecified complications: Secondary | ICD-10-CM

## 2014-03-28 DIAGNOSIS — J069 Acute upper respiratory infection, unspecified: Secondary | ICD-10-CM

## 2014-03-28 DIAGNOSIS — B9789 Other viral agents as the cause of diseases classified elsewhere: Principal | ICD-10-CM

## 2014-03-28 MED ORDER — LORATADINE-PSEUDOEPHEDRINE ER 5-120 MG PO TB12: 1.0000 | ORAL_TABLET | ORAL | Status: AC

## 2014-03-28 MED ORDER — HYDROCODONE-HOMATROPINE 5-1.5 MG/5ML PO SYRP: 5.0000 mL | ORAL_SOLUTION | Freq: Every evening | ORAL | Status: AC | PRN

## 2014-03-28 MED ORDER — ACETAMINOPHEN 500 MG PO TABS: 500.0000 mg | ORAL_TABLET | ORAL | Status: DC | PRN

## 2014-03-28 NOTE — Progress Notes (Signed)
IDENTIFYING INFORMATION  Marissa Adams / DOB: 08-13-1985 / MRN: [id number]  The patient has CD (Crohn's disease) and Impaired fasting glucose on her problem list.  SUBJECTIVE  CC: Cough; Nasal Congestion; and Headache   HPI: [name] is a well appearing 29 y.o. y.o. never smoking female presenting for cold like symptoms that started on 4 days ago with coughing and congestion. These symptoms have persisted and today she complains of a headache. Her cough is productive with yellowish phlem. She denies ear and eye symptoms. She denies hematemesis. She denies SOB and wheezing.  She denies chest pain and palpitations. She denies sore throat.   She denies a history of allergies and asthma. She has a history of Chron Disease and is medication compliant.  Her symptoms are in good control at this time.  She has been instructed by her GI to avoid NSAID therapy.    She  has a past medical history of Diabetes mellitus without complication; Blood transfusion without reported diagnosis; and Crohn's disease.    She has a current medication list which includes the following prescription(s): certolizumab pegol.  [name] has No Known Allergies. She  reports that she has never smoked. She does not have any smokeless tobacco history on file. She reports that she does not drink alcohol or use illicit drugs. She  has no sexual activity history on file.  The patient  has no past surgical history on file.  Her family history includes Diabetes in her maternal grandmother and mother; Hypertension in her maternal grandmother and paternal grandmother; Stroke in her paternal uncle.  Review of Systems  Constitutional: Positive for malaise/fatigue. Negative for fever, chills, weight loss and diaphoresis.  HENT: Positive for congestion. Negative for sore throat.   Respiratory: Positive for cough and sputum production. Negative for hemoptysis, shortness of breath and wheezing.   Cardiovascular: Negative for  chest pain and palpitations.  Gastrointestinal: Negative for nausea and abdominal pain.  Genitourinary: Negative.   Musculoskeletal: Negative for myalgias.  Skin: Negative for rash.  Neurological: Positive for headaches. Negative for dizziness and weakness.    OBJECTIVE  Blood pressure 102/66, pulse 84, temperature 97.8 F (36.6 C), temperature source Oral, resp. rate 19, height 5' 4.5" (1.638 m), weight 144 lb 9.6 oz (65.59 kg), last menstrual period 02/17/2014, SpO2 100 %. The patient's body mass index is 24.45 kg/(m^2).  Physical Exam  Constitutional: She is oriented to person, place, and time. She appears well-developed and well-nourished. No distress.  HENT:  Right Ear: Hearing, tympanic membrane, external ear and ear canal normal.  Left Ear: Hearing, tympanic membrane, external ear and ear canal normal.  Nose: Mucosal edema present. Right sinus exhibits no maxillary sinus tenderness and no frontal sinus tenderness. Left sinus exhibits no maxillary sinus tenderness and no frontal sinus tenderness.  Mouth/Throat: Uvula is midline, oropharynx is clear and moist and mucous membranes are normal.  Neck: Normal range of motion.  Cardiovascular: Normal rate, regular rhythm, normal heart sounds and intact distal pulses.   Respiratory: Effort normal and breath sounds normal. She has no wheezes. She has no rales.  GI: Soft. Bowel sounds are normal.  Musculoskeletal: Normal range of motion.  Lymphadenopathy:    She has no cervical adenopathy.  Neurological: She is alert and oriented to person, place, and time.  Skin: Skin is warm and dry. No rash noted. She is not diaphoretic. No erythema. No pallor.  Psychiatric: She has a normal mood and affect.    No  results found for this or any previous visit (from the past 24 hour(s)).  ASSESSMENT & PLAN  Marissa Adams was seen today for cough, nasal congestion and headache.  Diagnoses and associated orders for this visit:  Viral URI with  cough - HYDROcodone-homatropine (HYCODAN) 5-1.5 MG/5ML syrup; Take 5 mLs by mouth at bedtime and may repeat dose one time if needed. - loratadine-pseudoephedrine (CLARITIN-D 12 HOUR) 5-120 MG per tablet; Take 1 tablet by mouth every morning. - acetaminophen (TYLENOL) 500 MG tablet; Take 1 tablet (500 mg total) by mouth every 4 (four) hours as needed. Take 2 tabs every 8 hours.  CD (Crohn's disease), unspecified complication -     Patient's symptoms in good control at this time.  NSAIDS are to be avoided per her GI.  The remainder of the above plan was checked for medication interactions with Certolizumab Pegol [place] and zero interactions were found.       The patient was instructed to to call or comeback to clinic as needed, or should symptoms warrant.  Marissa Adams, MHS, PA-C [facility] 03/28/2014 12:20 PM

## 2014-10-15 DIAGNOSIS — K50119 Crohn's disease of large intestine with unspecified complications: Secondary | ICD-10-CM | POA: Insufficient documentation

## 2015-03-16 ENCOUNTER — Encounter: Payer: BC Managed Care – PPO | Admitting: Internal Medicine

## 2015-04-05 ENCOUNTER — Emergency Department (HOSPITAL_COMMUNITY)
Admission: EM | Admit: 2015-04-05 | Discharge: 2015-04-06 | Disposition: A | Discharge status: Home / Self Care | Payer: BLUE CROSS/BLUE SHIELD | Attending: Emergency Medicine | Admitting: Emergency Medicine

## 2015-04-05 ENCOUNTER — Encounter (HOSPITAL_COMMUNITY): Payer: Self-pay | Admitting: Emergency Medicine

## 2015-04-05 DIAGNOSIS — R000 Tachycardia, unspecified: Secondary | ICD-10-CM | POA: Insufficient documentation

## 2015-04-05 DIAGNOSIS — E119 Type 2 diabetes mellitus without complications: Secondary | ICD-10-CM | POA: Insufficient documentation

## 2015-04-05 DIAGNOSIS — R531 Weakness: Secondary | ICD-10-CM | POA: Insufficient documentation

## 2015-04-05 DIAGNOSIS — Z3202 Encounter for pregnancy test, result negative: Secondary | ICD-10-CM | POA: Diagnosis not present

## 2015-04-05 DIAGNOSIS — Z79899 Other long term (current) drug therapy: Secondary | ICD-10-CM | POA: Insufficient documentation

## 2015-04-05 DIAGNOSIS — K529 Noninfective gastroenteritis and colitis, unspecified: Secondary | ICD-10-CM | POA: Diagnosis not present

## 2015-04-05 DIAGNOSIS — R5383 Other fatigue: Secondary | ICD-10-CM | POA: Diagnosis not present

## 2015-04-05 DIAGNOSIS — R197 Diarrhea, unspecified: Secondary | ICD-10-CM | POA: Diagnosis present

## 2015-04-05 LAB — URINALYSIS, ROUTINE W REFLEX MICROSCOPIC
BILIRUBIN URINE: NEGATIVE
GLUCOSE, UA: NEGATIVE mg/dL
HGB URINE DIPSTICK: NEGATIVE
KETONES UR: 15 mg/dL — AB
Nitrite: NEGATIVE
PH: 5 (ref 5.0–8.0)
PROTEIN: NEGATIVE mg/dL
Specific Gravity, Urine: 1.019 (ref 1.005–1.030)

## 2015-04-05 LAB — COMPREHENSIVE METABOLIC PANEL
ALBUMIN: 3.4 g/dL — AB (ref 3.5–5.0)
ALT: 13 U/L — AB (ref 14–54)
AST: 25 U/L (ref 15–41)
Alkaline Phosphatase: 54 U/L (ref 38–126)
Anion gap: 9 (ref 5–15)
BILIRUBIN TOTAL: 0.8 mg/dL (ref 0.3–1.2)
CHLORIDE: 102 mmol/L (ref 101–111)
CO2: 25 mmol/L (ref 22–32)
CREATININE: 1.16 mg/dL — AB (ref 0.44–1.00)
Calcium: 8.7 mg/dL — ABNORMAL LOW (ref 8.9–10.3)
GFR calc Af Amer: >60 mL/min (ref >60)
GLUCOSE: 133 mg/dL — AB (ref 65–99)
POTASSIUM: 3.9 mmol/L (ref 3.5–5.1)
Sodium: 136 mmol/L (ref 135–145)
Total Protein: 8.1 g/dL (ref 6.5–8.1)

## 2015-04-05 LAB — CBC
HEMATOCRIT: 41.3 % (ref 36.0–46.0)
Hemoglobin: 13.7 g/dL (ref 12.0–15.0)
MCH: 27.8 pg (ref 26.0–34.0)
MCHC: 33.2 g/dL (ref 30.0–36.0)
MCV: 83.9 fL (ref 78.0–100.0)
PLATELETS: 257 10*3/uL (ref 150–400)
RBC: 4.92 MIL/uL (ref 3.87–5.11)
RDW: 14 % (ref 11.5–15.5)
WBC: 6.4 10*3/uL (ref 4.0–10.5)

## 2015-04-05 LAB — URINE MICROSCOPIC-ADD ON
Bacteria, UA: NONE SEEN
RBC / HPF: NONE SEEN RBC/hpf (ref 0–5)

## 2015-04-05 LAB — I-STAT BETA HCG BLOOD, ED (MC, WL, AP ONLY): I-stat hCG, quantitative: <5 m[IU]/mL (ref <5)

## 2015-04-05 LAB — LIPASE, BLOOD: LIPASE: 30 U/L (ref 11–51)

## 2015-04-05 MED ORDER — PROMETHAZINE HCL 25 MG/ML IJ SOLN
25.0000 mg | Freq: Once | INTRAMUSCULAR | Status: AC
  Administered 2015-04-06: 25 mg via INTRAVENOUS
  Filled 2015-04-05: qty 1

## 2015-04-05 MED ORDER — SODIUM CHLORIDE 0.9 % IV BOLUS (SEPSIS)
1000.0000 mL | Freq: Once | INTRAVENOUS | Status: AC
  Administered 2015-04-06: 1000 mL via INTRAVENOUS

## 2015-04-05 MED ORDER — ACETAMINOPHEN 325 MG PO TABS
ORAL_TABLET | ORAL | Status: AC
  Filled 2015-04-05: qty 2

## 2015-04-05 MED ORDER — ACETAMINOPHEN 325 MG PO TABS
650.0000 mg | ORAL_TABLET | Freq: Once | ORAL | Status: AC
  Administered 2015-04-05: 650 mg via ORAL

## 2015-04-05 NOTE — ED Notes (Signed)
Pt. reports emesis with diarrhea , chills , fever , productive cough and generalized body aches onset 4 days ago.

## 2015-04-05 NOTE — ED Provider Notes (Signed)
CSN: [id number]     Arrival date & time 04/05/15  1910 History   First MD Initiated Contact with Patient 04/05/15 2324     Chief Complaint  Patient presents with  . Emesis  . Diarrhea     (Consider location/radiation/quality/duration/timing/severity/associated sxs/prior Treatment) HPI Marissa Adams is a 30 year old female with past medical history of crohn's disease who presents to the ED complaining of emesis and diarrhea. Patient said the symptoms began 4 days ago and is not associated with abdominal pain. She denies blood in stool or vomit. She endorses nonproductive cough x1 day with post tussive emesis, anorexia, fatigue, night sweats, chills. Tylenol resolved HA, dizziness and lightheadedness. Denies SOB, fever. Past Medical History  Diagnosis Date  . Diabetes mellitus without complication (HCC)   . Blood transfusion without reported diagnosis   . Crohn's disease (HCC)    History reviewed. No pertinent past surgical history. Family History  Problem Relation Age of Onset  . Diabetes Mother   . Stroke Paternal Uncle   . Hypertension Maternal Grandmother   . Diabetes Maternal Grandmother   . Hypertension Paternal Grandmother    Social History  Substance Use Topics  . Smoking status: Never Smoker   . Smokeless tobacco: None  . Alcohol Use: No   OB History    No data available     Review of Systems  Constitutional: Positive for chills, diaphoresis, appetite change and fatigue. Negative for fever.  HENT: Negative for congestion.   Respiratory: Positive for cough. Negative for chest tightness, shortness of breath and wheezing.   Cardiovascular: Negative for chest pain and palpitations.  Gastrointestinal: Positive for nausea, vomiting and diarrhea. Negative for abdominal pain, constipation, blood in stool and abdominal distention.  Genitourinary: Negative for dysuria and difficulty urinating.  Neurological: Positive for weakness. Negative for dizziness, syncope,  light-headedness and headaches.  Hematological: Negative for adenopathy.      Allergies  Review of patient's allergies indicates no known allergies.  Home Medications   Prior to Admission medications   Medication Sig Start Date End Date Taking? Authorizing Provider  acetaminophen (TYLENOL) 500 MG tablet Take 1 tablet (500 mg total) by mouth every 4 (four) hours as needed. Take 2 tabs every 8 hours. 03/28/14   Ofilia Neas, PA-C  CERTOLIZUMAB PEGOL [place] Inject 400 mg into the skin every 14 (fourteen) days. 1 11/23/09   Historical Provider, MD   BP 115/68 mmHg  Pulse 96  Temp(Src) 98.3 F (36.8 C) (Oral)  Resp 17  SpO2 100%  LMP 03/08/2015 Physical Exam  Constitutional: She is oriented to person, place, and time. She appears well-developed and well-nourished. No distress.  HENT:  Head: Normocephalic and atraumatic.  Mouth/Throat: Oropharynx is clear and moist.  Difficult to visualize posterior oropharynx. Pale oral mucosa  Eyes: Pupils are equal, round, and reactive to light.  Neck: Normal range of motion. Neck supple.  Cardiovascular: Regular rhythm, normal heart sounds and intact distal pulses.  Tachycardia present.  Exam reveals no gallop and no friction rub.   No murmur heard. Pulmonary/Chest: Effort normal and breath sounds normal. No respiratory distress.  Abdominal: Soft. Bowel sounds are normal. She exhibits no distension. There is no tenderness. There is no rebound and no guarding.  Hyperdynamic bowel sounds in lower quadrants  Neurological: She is alert and oriented to person, place, and time. She exhibits normal muscle tone. Coordination normal.  Skin: Skin is warm and dry. She is not diaphoretic.  Psychiatric: She has a normal mood  and affect. Her behavior is normal.  Nursing note and vitals reviewed.   ED Course  Procedures (including critical care time) Labs Review Labs Reviewed  COMPREHENSIVE METABOLIC PANEL - Abnormal; Notable for the following:    Glucose,  Bld 133 (*)    BUN <5 (*)    Creatinine, Ser 1.16 (*)    Calcium 8.7 (*)    Albumin 3.4 (*)    ALT 13 (*)    All other components within normal limits  URINALYSIS, ROUTINE W REFLEX MICROSCOPIC (NOT AT [facility]) - Abnormal; Notable for the following:    Ketones, ur 15 (*)    Leukocytes, UA TRACE (*)    All other components within normal limits  URINE MICROSCOPIC-ADD ON - Abnormal; Notable for the following:    Squamous Epithelial / LPF 0-5 (*)    Casts GRANULAR CAST (*)    All other components within normal limits  LIPASE, BLOOD  CBC  I-STAT BETA HCG BLOOD, ED ([facility], [facility], [facility] ONLY)    Imaging Review No results found. I have personally reviewed and evaluated these images and lab results as part of my medical decision-making.   EKG Interpretation None      Patient is receiving IV fluids and antiemetics.  Patient is feeling better at this time.  Advised her to follow-up with her GI specialist.  Patient agrees to the plan and all questions were answered.  Patient most likely has a gastroenteritis based on her history and physical exam findings.  Patient is advised follow-up with her GI doctor  Charlestine Night, PA-C 04/06/15 1610  Vanetta Mulders, MD 04/06/15 [phone]

## 2015-04-06 MED ORDER — SODIUM CHLORIDE 0.9 % IV BOLUS (SEPSIS)
1000.0000 mL | Freq: Once | INTRAVENOUS | Status: AC
  Administered 2015-04-06: 1000 mL via INTRAVENOUS

## 2015-04-06 MED ORDER — PROMETHAZINE HCL 25 MG PO TABS: 25.0000 mg | ORAL_TABLET | Freq: Four times a day (QID) | ORAL | Status: DC | PRN

## 2015-04-06 NOTE — Discharge Instructions (Signed)
Return here as needed.  Follow-up with your GI doctor and primary care.  Slowly increase her fluid intake, rest as much as possible

## 2015-09-24 ENCOUNTER — Emergency Department (HOSPITAL_COMMUNITY)
Admission: EM | Admit: 2015-09-24 | Discharge: 2015-09-25 | Disposition: A | Discharge status: Home / Self Care | Payer: BLUE CROSS/BLUE SHIELD | Attending: Emergency Medicine | Admitting: Emergency Medicine

## 2015-09-24 ENCOUNTER — Encounter (HOSPITAL_COMMUNITY): Payer: Self-pay | Admitting: *Deleted

## 2015-09-24 DIAGNOSIS — Z79899 Other long term (current) drug therapy: Secondary | ICD-10-CM | POA: Diagnosis not present

## 2015-09-24 DIAGNOSIS — Z3A01 Less than 8 weeks gestation of pregnancy: Secondary | ICD-10-CM | POA: Diagnosis not present

## 2015-09-24 DIAGNOSIS — O99611 Diseases of the digestive system complicating pregnancy, first trimester: Secondary | ICD-10-CM | POA: Insufficient documentation

## 2015-09-24 DIAGNOSIS — E119 Type 2 diabetes mellitus without complications: Secondary | ICD-10-CM | POA: Insufficient documentation

## 2015-09-24 DIAGNOSIS — K50911 Crohn's disease, unspecified, with rectal bleeding: Secondary | ICD-10-CM | POA: Diagnosis not present

## 2015-09-24 DIAGNOSIS — O26891 Other specified pregnancy related conditions, first trimester: Secondary | ICD-10-CM | POA: Diagnosis present

## 2015-09-24 LAB — POC URINE PREG, ED: Preg Test, Ur: POSITIVE — AB

## 2015-09-24 LAB — COMPREHENSIVE METABOLIC PANEL
ALBUMIN: 2.9 g/dL — AB (ref 3.5–5.0)
ALT: 28 U/L (ref 14–54)
AST: 20 U/L (ref 15–41)
Alkaline Phosphatase: 70 U/L (ref 38–126)
Anion gap: 6 (ref 5–15)
BILIRUBIN TOTAL: 0.6 mg/dL (ref 0.3–1.2)
CO2: 23 mmol/L (ref 22–32)
Calcium: 8.7 mg/dL — ABNORMAL LOW (ref 8.9–10.3)
Chloride: 105 mmol/L (ref 101–111)
Creatinine, Ser: 0.87 mg/dL (ref 0.44–1.00)
Glucose, Bld: 96 mg/dL (ref 65–99)
POTASSIUM: 4.2 mmol/L (ref 3.5–5.1)
Sodium: 134 mmol/L — ABNORMAL LOW (ref 135–145)
TOTAL PROTEIN: 7.4 g/dL (ref 6.5–8.1)

## 2015-09-24 LAB — URINALYSIS, ROUTINE W REFLEX MICROSCOPIC
Bilirubin Urine: NEGATIVE
GLUCOSE, UA: NEGATIVE mg/dL
Hgb urine dipstick: NEGATIVE
Ketones, ur: NEGATIVE mg/dL
Nitrite: NEGATIVE
PH: 6 (ref 5.0–8.0)
Protein, ur: NEGATIVE mg/dL
Specific Gravity, Urine: 1.017 (ref 1.005–1.030)

## 2015-09-24 LAB — CBC
HEMATOCRIT: 37.9 % (ref 36.0–46.0)
Hemoglobin: 12.6 g/dL (ref 12.0–15.0)
MCH: 28.4 pg (ref 26.0–34.0)
MCHC: 33.2 g/dL (ref 30.0–36.0)
MCV: 85.4 fL (ref 78.0–100.0)
Platelets: 301 10*3/uL (ref 150–400)
RBC: 4.44 MIL/uL (ref 3.87–5.11)
RDW: 13.9 % (ref 11.5–15.5)
WBC: 9.9 10*3/uL (ref 4.0–10.5)

## 2015-09-24 LAB — URINE MICROSCOPIC-ADD ON

## 2015-09-24 LAB — LIPASE, BLOOD: Lipase: 23 U/L (ref 11–51)

## 2015-09-24 NOTE — ED Notes (Signed)
Called for Pt to go to RM. No Answer

## 2015-09-24 NOTE — ED Provider Notes (Signed)
History  By signing my name below, I, Marissa Adams, attest that this documentation has been prepared under the direction and in the presence of Tomasita Crumble, MD. Electronically Signed: Earmon Adams, ED Scribe. 09/25/2015. 12:41 AM.  Chief Complaint  Patient presents with  . Abdominal Pain   The history is provided by the patient and medical records. No language interpreter was used.    HPI Comments:  Marissa Adams is a 30 y.o. female with PMHx of DM and Crohn's disease who presents to the Emergency Department complaining of nausea, vomiting and hematochezia that began about one week ago. She reports the blood in her stool is bright red and has been having abdominal pain. She states she contacted her GI doctor about the symptoms but has not heard back from him. She states this feels similar to her typical Crohn's flare ups. Pt reports she is currently on Prednisone for the Crohn's. She states she was on Humira but stopped that at the end of May. She states she had an abdominal MRI last month. She denies taking anything for her current symptoms besides the prescribed Prednisone. She denies modifying factors. She denies any dysuria, hematuria, fever, chills. Pt reports her LMP was 07/26/15.   Past Medical History  Diagnosis Date  . Diabetes mellitus without complication (HCC)   . Blood transfusion without reported diagnosis   . Crohn's disease (HCC)    History reviewed. No pertinent past surgical history. Family History  Problem Relation Age of Onset  . Diabetes Mother   . Stroke Paternal Uncle   . Hypertension Maternal Grandmother   . Diabetes Maternal Grandmother   . Hypertension Paternal Grandmother    Social History  Substance Use Topics  . Smoking status: Never Smoker   . Smokeless tobacco: None  . Alcohol Use: No   OB History    No data available     Review of Systems A complete 10 system review of systems was obtained and all systems are negative except as noted  in the HPI and PMH.   Allergies  Contrast media  Home Medications   Prior to Admission medications   Medication Sig Start Date End Date Taking? Authorizing Provider  acetaminophen (TYLENOL) 500 MG tablet Take 1 tablet (500 mg total) by mouth every 4 (four) hours as needed. Take 2 tabs every 8 hours. 03/28/14   Ofilia Neas, PA-C  CERTOLIZUMAB PEGOL [place] Inject 400 mg into the skin every 14 (fourteen) days. 1 11/23/09   Historical Provider, MD  promethazine (PHENERGAN) 25 MG tablet Take 1 tablet (25 mg total) by mouth every 6 (six) hours as needed for nausea or vomiting. 04/06/15   Charlestine Night, PA-C   Triage Vitals: BP 127/83 mmHg  Pulse 118  Temp(Src) 99.1 F (37.3 C) (Oral)  Resp 18  Ht  (1.626 m)  Wt 165 lb (74.844 kg)  BMI 28.31 kg/m2  SpO2 100%  LMP 08/25/2015 Physical Exam  Constitutional: She is oriented to person, place, and time. She appears well-developed and well-nourished. No distress.  HENT:  Head: Normocephalic and atraumatic.  Nose: Nose normal.  Mouth/Throat: Oropharynx is clear and moist. No oropharyngeal exudate.  Eyes: Conjunctivae and EOM are normal. Pupils are equal, round, and reactive to light. No scleral icterus.  Neck: Normal range of motion. Neck supple. No JVD present. No tracheal deviation present. No thyromegaly present.  Cardiovascular: Regular rhythm and normal heart sounds.  Tachycardia present.  Exam reveals no gallop and no friction rub.  No murmur heard. Pulmonary/Chest: Effort normal and breath sounds normal. No respiratory distress. She has no wheezes. She exhibits no tenderness.  Abdominal: Soft. Bowel sounds are normal. She exhibits no distension and no mass. There is no tenderness. There is no rebound and no guarding.  Musculoskeletal: Normal range of motion. She exhibits no edema or tenderness.  Lymphadenopathy:    She has no cervical adenopathy.  Neurological: She is alert and oriented to person, place, and time. No cranial  nerve deficit. She exhibits normal muscle tone.  Skin: Skin is warm and dry. No rash noted. No erythema. No pallor.  Nursing note and vitals reviewed.   ED Course  Procedures (including critical care time) DIAGNOSTIC STUDIES: Oxygen Saturation is 100% on RA, normal by my interpretation.   COORDINATION OF CARE: 12:22 AM- Will order IV fluids, Zofran and antibiotics prior to discharge. Informed pt of positive pregnancy test and advised pt to stop taking Prednisone since it is not working anyway. Advised pt to follow up with OB and GI doctors. Pt verbalizes understanding and agrees to plan.  Medications  sodium chloride 0.9 % bolus 1,000 mL (not administered)  ondansetron (ZOFRAN) injection 4 mg (not administered)  metroNIDAZOLE (FLAGYL) tablet 500 mg (not administered)    Labs Review Labs Reviewed  COMPREHENSIVE METABOLIC PANEL - Abnormal; Notable for the following:    Sodium 134 (*)    BUN <5 (*)    Calcium 8.7 (*)    Albumin 2.9 (*)    All other components within normal limits  URINALYSIS, ROUTINE W REFLEX MICROSCOPIC (NOT AT [facility]) - Abnormal; Notable for the following:    Color, Urine AMBER (*)    Leukocytes, UA SMALL (*)    All other components within normal limits  URINE MICROSCOPIC-ADD ON - Abnormal; Notable for the following:    Squamous Epithelial / LPF 6-30 (*)    Bacteria, UA FEW (*)    All other components within normal limits  POC URINE PREG, ED - Abnormal; Notable for the following:    Preg Test, Ur POSITIVE (*)    All other components within normal limits  LIPASE, BLOOD  CBC    Imaging Review No results found. I have personally reviewed and evaluated these images and lab results as part of my medical decision-making.   EKG Interpretation None      MDM   Final diagnoses:  None     Patient presents to the ED for Crohns flare.  She is currently taking low dose prednisone as part of a 1 month taper and was advised to stop for adverse problems with the  fetus.  She was informed of pregnancy which she did not know prior.  She was given IVF and zofran.  Her nausea may be due to pregnancy and not Crohns, however she also states this feels typical of her Crohns.  Will Dc with flagyl for Gi bleeding in case this is an infectious component of her Crohns.  Her abdomen is nontender, no indication for CT scan.  OB and GI follow up advised within the next week.  She appears well and in NAD.  VS remain within her normal limits and she is safe for DC.  I personally performed the services described in this documentation, which was scribed in my presence. The recorded information has been reviewed and is accurate.      Tomasita Crumble, MD 09/25/15 [phone]

## 2015-09-24 NOTE — ED Notes (Signed)
The pt is c/o abd pain since Saturday with nausea she has bloody diarrhea but that goes along with her crohns disease symptoms.  lmp  june

## 2015-09-25 MED ORDER — METRONIDAZOLE 500 MG PO TABS
500.0000 mg | ORAL_TABLET | Freq: Once | ORAL | Status: AC
  Administered 2015-09-25: 500 mg via ORAL
  Filled 2015-09-25: qty 1

## 2015-09-25 MED ORDER — ONDANSETRON HCL 4 MG/2ML IJ SOLN
4.0000 mg | Freq: Once | INTRAMUSCULAR | Status: AC
  Administered 2015-09-25: 4 mg via INTRAVENOUS
  Filled 2015-09-25: qty 2

## 2015-09-25 MED ORDER — METRONIDAZOLE 500 MG PO TABS: 500.0000 mg | ORAL_TABLET | Freq: Three times a day (TID) | ORAL | Status: DC

## 2015-09-25 MED ORDER — SODIUM CHLORIDE 0.9 % IV BOLUS (SEPSIS)
1000.0000 mL | Freq: Once | INTRAVENOUS | Status: AC
  Administered 2015-09-25: 1000 mL via INTRAVENOUS

## 2015-09-25 MED ORDER — ONDANSETRON 4 MG PO TBDP: 4.0000 mg | ORAL_TABLET | Freq: Three times a day (TID) | ORAL | Status: DC | PRN

## 2015-09-25 NOTE — Discharge Instructions (Signed)
Abdominal Pain During Pregnancy Marissa Adams, stop taking prednisone as this is not recommended in pregnancy.  Take antibiotics as directed and see your GI and OB doctor within the next week for close follow up.  You can take zofran as needed for nausea.  If symptoms worsen, come back to the ED immediately. Thank you. Belly (abdominal) pain is common during pregnancy. Most of the time, it is not a serious problem. Other times, it can be a sign that something is wrong with the pregnancy. Always tell your doctor if you have belly pain. HOME CARE Monitor your belly pain for any changes. The following actions may help you feel better:  Do not have sex (intercourse) or put anything in your vagina until you feel better.  Rest until your pain stops.  Drink clear fluids if you feel sick to your stomach (nauseous). Do not eat solid food until you feel better.  Only take medicine as told by your doctor.  Keep all doctor visits as told. GET HELP RIGHT AWAY IF:   You are bleeding, leaking fluid, or pieces of tissue come out of your vagina.  You have more pain or cramping.  You keep throwing up (vomiting).  You have pain when you pee (urinate) or have blood in your pee.  You have a fever.  You do not feel your baby moving as much.  You feel very weak or feel like passing out.  You have trouble breathing, with or without belly pain.  You have a very bad headache and belly pain.  You have fluid leaking from your vagina and belly pain.  You keep having watery poop (diarrhea).  Your belly pain does not go away after resting, or the pain gets worse. MAKE SURE YOU:   Understand these instructions.  Will watch your condition.  Will get help right away if you are not doing well or get worse.   This information is not intended to replace advice given to you by your health care provider. Make sure you discuss any questions you have with your health care provider.   Document Released:  02/15/2009 Document Revised: 10/30/2012 Document Reviewed: 09/26/2012 Elsevier Interactive Patient Education 2016 [employer] Crohn Disease Crohn disease is a long-lasting (chronic) disease that affects your gastrointestinal (GI) tract. It often causes irritation and swelling (inflammation) in your small intestine and the beginning of your large intestine. However, it can affect any part of your GI tract. Crohn disease is part of a group of illnesses that are known as inflammatory bowel disease (IBD). Crohn disease may start slowly and get worse over time. Symptoms may come and go. They may also disappear for months or even years at a time (remission). CAUSES The exact cause of Crohn disease is not known. It may be a response that causes your body's defense system (immune system) to mistakenly attack healthy cells and tissues (autoimmune response). Your genes and your environment may also play a role. RISK FACTORS You may be at greater risk for Crohn disease if you:  Have other family members with Crohn disease or another IBD.  Use any tobacco products, including cigarettes, chewing tobacco, or electronic cigarettes.  Are in your 3s.  Have [place] European ancestry. SIGNS AND SYMPTOMS The main signs and symptoms of Crohn disease involve your GI tract. These include:  Diarrhea.  Rectal bleeding.  An urgent need to move your bowels.  The feeling that you are not finished having a bowel movement.  Abdominal pain or cramping.  Constipation. General signs and symptoms of Crohn disease may also include:  Unexplained weight loss.  Fatigue.  Fever.  Nausea.  Loss of appetite.  Joint pain  Changes in vision.  Red bumps on your skin. DIAGNOSIS Your health care provider may suspect Crohn disease based on your symptoms and your medical history. Your health care provider will do a physical exam. You may need to see a health care provider who specializes in diseases of the  digestive tract (gastroenterologist). You may also have tests to help your health care providers make a diagnosis. These may include:  Blood tests.  Stool sample tests.  Imaging tests, such as X-rays and CT scans.  Tests to examine the inside of your intestines using a long, flexible tube that has a light and a camera on the end (endoscopy or colonoscopy).  A procedure to take tissue samples from inside your bowel (biopsy) to be examined under a microscope. TREATMENT  There is no cure for Crohn disease. Treatment will focus on managing your symptoms. Crohn disease affects each person differently. Your treatment may include:  Resting your bowels. Drinking only clear liquids or getting nutrition through an IV for a period of time gives your bowels a chance to heal because they are not passing stools.  Medicines. These may be used alone or in combination (combination therapy). These may include antibiotic medicines. You may be given medicines that help to:  Reduce inflammation.  Control your immune system activity.  Fight infections.  Relieve cramps and prevent diarrhea.  Control your pain.  Surgery. You may need surgery if:  Medicines and other treatments are no longer working.  You develop complications from severe Crohn disease.  A section of your intestine becomes so damaged that it needs to be removed. HOME CARE INSTRUCTIONS  Take medicines only as directed by your health care provider.  If you were prescribed an antibiotic medicine, finish it all even if you start to feel better.  Keep all follow-up visits as directed by your health care provider. This is important.  Talk with your health care provider about changing your diet. This may help your symptoms. Your health care provide may recommend changes, such as:  Drinking more fluids.  Avoiding milk and other foods that contain lactose.  Eating a low-fat diet.  Avoiding high-fiber foods, such as popcorn and  nuts.  Avoiding carbonated beverages, such as soda.  Eating smaller meals more often rather than eating large meals.  Keeping a food diary to identify foods that make your symptoms better or worse.  Do not use any tobacco products, including cigarettes, chewing tobacco, or electronic cigarettes. If you need help quitting, ask your health care provider.  Limit alcohol intake to no more than 1 drink per day for nonpregnant women and 2 drinks per day for men. One drink equals 12 ounces of beer, 5 ounces of wine, or 1 ounces of hard liquor.  Exercise daily or as directed by your health care provider. SEEK MEDICAL CARE IF:  You have diarrhea, abdominal cramps, and other gastrointestinal problems that are present almost all of the time.  Your symptoms do not improve with treatment.  You continue to lose weight.  You develop a rash or sores on your skin.  You develop eye problems.  You have a fever.   Your symptoms get worse.  You develop new symptoms. SEEK IMMEDIATE MEDICAL CARE IF:  You have bloody diarrhea.  You develop severe abdominal pain.  You cannot pass stools.  This information is not intended to replace advice given to you by your health care provider. Make sure you discuss any questions you have with your health care provider.   Document Released: 12/07/2004 Document Revised: 03/20/2014 Document Reviewed: 10/15/2013 Elsevier Interactive Patient Education [employer]

## 2015-10-02 ENCOUNTER — Emergency Department (HOSPITAL_COMMUNITY): Payer: BLUE CROSS/BLUE SHIELD

## 2015-10-02 ENCOUNTER — Encounter (HOSPITAL_COMMUNITY): Payer: Self-pay

## 2015-10-02 ENCOUNTER — Emergency Department (HOSPITAL_COMMUNITY)
Admission: EM | Admit: 2015-10-02 | Discharge: 2015-10-03 | Disposition: A | Discharge status: Home / Self Care | Payer: BLUE CROSS/BLUE SHIELD | Attending: Emergency Medicine | Admitting: Emergency Medicine

## 2015-10-02 DIAGNOSIS — R102 Pelvic and perineal pain: Secondary | ICD-10-CM | POA: Insufficient documentation

## 2015-10-02 DIAGNOSIS — Z3A10 10 weeks gestation of pregnancy: Secondary | ICD-10-CM | POA: Diagnosis not present

## 2015-10-02 DIAGNOSIS — Z3490 Encounter for supervision of normal pregnancy, unspecified, unspecified trimester: Secondary | ICD-10-CM

## 2015-10-02 DIAGNOSIS — K611 Rectal abscess: Secondary | ICD-10-CM

## 2015-10-02 DIAGNOSIS — K6289 Other specified diseases of anus and rectum: Secondary | ICD-10-CM

## 2015-10-02 DIAGNOSIS — O9989 Other specified diseases and conditions complicating pregnancy, childbirth and the puerperium: Secondary | ICD-10-CM | POA: Diagnosis not present

## 2015-10-02 DIAGNOSIS — K5090 Crohn's disease, unspecified, without complications: Secondary | ICD-10-CM | POA: Diagnosis not present

## 2015-10-02 DIAGNOSIS — O26891 Other specified pregnancy related conditions, first trimester: Secondary | ICD-10-CM | POA: Diagnosis present

## 2015-10-02 LAB — CBC WITH DIFFERENTIAL/PLATELET
BASOS PCT: 0 %
Basophils Absolute: 0 10*3/uL (ref 0.0–0.1)
EOS ABS: 0 10*3/uL (ref 0.0–0.7)
Eosinophils Relative: 0 %
HEMATOCRIT: 36 % (ref 36.0–46.0)
Hemoglobin: 12.1 g/dL (ref 12.0–15.0)
Lymphocytes Relative: 12 %
Lymphs Abs: 1.8 10*3/uL (ref 0.7–4.0)
MCH: 28.8 pg (ref 26.0–34.0)
MCHC: 33.6 g/dL (ref 30.0–36.0)
MCV: 85.7 fL (ref 78.0–100.0)
MONO ABS: 0.9 10*3/uL (ref 0.1–1.0)
MONOS PCT: 6 %
NEUTROS ABS: 12.7 10*3/uL — AB (ref 1.7–7.7)
Neutrophils Relative %: 82 %
Platelets: 429 10*3/uL — ABNORMAL HIGH (ref 150–400)
RBC: 4.2 MIL/uL (ref 3.87–5.11)
RDW: 13.8 % (ref 11.5–15.5)
WBC: 15.5 10*3/uL — ABNORMAL HIGH (ref 4.0–10.5)

## 2015-10-02 LAB — COMPREHENSIVE METABOLIC PANEL
ALBUMIN: 3.1 g/dL — AB (ref 3.5–5.0)
ALK PHOS: 85 U/L (ref 38–126)
ALT: 11 U/L — ABNORMAL LOW (ref 14–54)
AST: 17 U/L (ref 15–41)
Anion gap: 9 (ref 5–15)
BILIRUBIN TOTAL: 1.1 mg/dL (ref 0.3–1.2)
BUN: 5 mg/dL — AB (ref 6–20)
CALCIUM: 8.8 mg/dL — AB (ref 8.9–10.3)
CO2: 22 mmol/L (ref 22–32)
Chloride: 104 mmol/L (ref 101–111)
Creatinine, Ser: 0.84 mg/dL (ref 0.44–1.00)
GFR calc Af Amer: >60 mL/min (ref >60)
GFR calc non Af Amer: >60 mL/min (ref >60)
GLUCOSE: 90 mg/dL (ref 65–99)
Potassium: 3.9 mmol/L (ref 3.5–5.1)
Sodium: 135 mmol/L (ref 135–145)
TOTAL PROTEIN: 7.8 g/dL (ref 6.5–8.1)

## 2015-10-02 LAB — URINALYSIS, ROUTINE W REFLEX MICROSCOPIC
BILIRUBIN URINE: NEGATIVE
Glucose, UA: NEGATIVE mg/dL
HGB URINE DIPSTICK: NEGATIVE
KETONES UR: 15 mg/dL — AB
NITRITE: NEGATIVE
PH: 5.5 (ref 5.0–8.0)
Protein, ur: NEGATIVE mg/dL
Specific Gravity, Urine: 1.021 (ref 1.005–1.030)

## 2015-10-02 LAB — URINE MICROSCOPIC-ADD ON: RBC / HPF: NONE SEEN RBC/hpf (ref 0–5)

## 2015-10-02 LAB — HCG, QUANTITATIVE, PREGNANCY: HCG, BETA CHAIN, QUANT, S: 204590 m[IU]/mL — AB (ref <5)

## 2015-10-02 LAB — LIPASE, BLOOD: Lipase: 14 U/L (ref 11–51)

## 2015-10-02 MED ORDER — LIDOCAINE HCL 2 % IJ SOLN
INTRAMUSCULAR | Status: AC
  Administered 2015-10-02: 10 mL via INTRADERMAL
  Filled 2015-10-02: qty 20

## 2015-10-02 MED ORDER — HYDROCODONE-ACETAMINOPHEN 5-325 MG PO TABS: 1.0000 | ORAL_TABLET | ORAL | Status: DC | PRN

## 2015-10-02 MED ORDER — SODIUM CHLORIDE 0.9 % IV BOLUS (SEPSIS)
1000.0000 mL | Freq: Once | INTRAVENOUS | Status: AC
  Administered 2015-10-02: 1000 mL via INTRAVENOUS

## 2015-10-02 MED ORDER — AMOXICILLIN-POT CLAVULANATE 875-125 MG PO TABS: 1.0000 | ORAL_TABLET | Freq: Two times a day (BID) | ORAL | Status: DC

## 2015-10-02 MED ORDER — MORPHINE SULFATE (PF) 4 MG/ML IV SOLN
4.0000 mg | Freq: Once | INTRAVENOUS | Status: AC
  Administered 2015-10-02: 4 mg via INTRAVENOUS
  Filled 2015-10-02: qty 1

## 2015-10-02 MED ORDER — LIDOCAINE HCL 2 % IJ SOLN
0.0000 mL | Freq: Once | INTRAMUSCULAR | Status: AC | PRN
  Administered 2015-10-02: 10 mL via INTRADERMAL

## 2015-10-02 MED ORDER — LIDOCAINE HCL (PF) 2 % IJ SOLN
10.0000 mL | Freq: Once | INTRAMUSCULAR | Status: DC
  Filled 2015-10-02: qty 10

## 2015-10-02 MED ORDER — SODIUM CHLORIDE 0.9 % IV SOLN
INTRAVENOUS | Status: DC
  Administered 2015-10-02: 17:00:00 via INTRAVENOUS

## 2015-10-02 MED ORDER — SODIUM CHLORIDE 0.9 % IV BOLUS (SEPSIS)
2000.0000 mL | Freq: Once | INTRAVENOUS | Status: AC
  Administered 2015-10-02: 2000 mL via INTRAVENOUS

## 2015-10-02 MED ORDER — PIPERACILLIN-TAZOBACTAM 3.375 G IVPB 30 MIN
3.3750 g | Freq: Once | INTRAVENOUS | Status: AC
  Administered 2015-10-02: 3.375 g via INTRAVENOUS
  Filled 2015-10-02: qty 50

## 2015-10-02 MED ORDER — MORPHINE SULFATE (PF) 4 MG/ML IV SOLN: 4.0000 mg | Freq: Once | INTRAVENOUS | Status: DC

## 2015-10-02 MED ORDER — ONDANSETRON HCL 4 MG/2ML IJ SOLN
4.0000 mg | Freq: Once | INTRAMUSCULAR | Status: AC
  Administered 2015-10-02: 4 mg via INTRAVENOUS
  Filled 2015-10-02: qty 2

## 2015-10-02 NOTE — ED Provider Notes (Addendum)
CSN: [id number]     Arrival date & time 10/02/15  1304 History   First MD Initiated Contact with Patient 10/02/15 1419     Chief Complaint  Patient presents with  . Rectal Pain  . Nausea  . [redacted] weeks pregnant     HPI Pt has been having trouble with nausea and rectal pain for over a week.  She was seen in the ED about 1 week ago and was told that she was pregnant.  The nausea persists but the rectal pain is causing her more difficulty.  She had a bowel movement yesterday that was normal.  No trouble with constipation although no BM today.  She saw her GI doctor on Wednesday and was told that she probably had a thrombosed hemorrhoids and was given medications.  She has not noticed any lumps on the outside.  The sx are all on the inside.  She also has pain on the left buttock.  She has had some chills,  NO definite fevers.  Still with nausea and occasional vomiting.  Hx of crohns but no history of abscess.  Some bloody stools about a week ago but none recently.  She also is having some urinary urgency.   Past Medical History  Diagnosis Date  . Diabetes mellitus without complication (HCC)   . Blood transfusion without reported diagnosis   . Crohn's disease (HCC)    History reviewed. No pertinent past surgical history. Family History  Problem Relation Age of Onset  . Diabetes Mother   . Stroke Paternal Uncle   . Hypertension Maternal Grandmother   . Diabetes Maternal Grandmother   . Hypertension Paternal Grandmother    Social History  Substance Use Topics  . Smoking status: Never Smoker   . Smokeless tobacco: None  . Alcohol Use: No   OB History    Gravida Para Term Preterm AB TAB SAB Ectopic Multiple Living   1              Review of Systems  All other systems reviewed and are negative.     Allergies  Contrast media  Home Medications   Prior to Admission medications   Medication Sig Start Date End Date Taking? Authorizing Provider  acetaminophen (TYLENOL) 500 MG tablet  Take 1 tablet (500 mg total) by mouth every 4 (four) hours as needed. Take 2 tabs every 8 hours. Patient taking differently: Take 500 mg by mouth every 4 (four) hours as needed for mild pain, moderate pain or headache.  03/28/14  Yes Ofilia Neas, PA-C  metroNIDAZOLE (FLAGYL) 500 MG tablet Take 1 tablet (500 mg total) by mouth 3 (three) times daily. Patient not taking: Reported on 10/02/2015 09/25/15   Tomasita Crumble, MD  ondansetron (ZOFRAN ODT) 4 MG disintegrating tablet Take 1 tablet (4 mg total) by mouth every 8 (eight) hours as needed for nausea or vomiting. Patient not taking: Reported on 10/02/2015 09/25/15   Tomasita Crumble, MD  promethazine (PHENERGAN) 25 MG tablet Take 1 tablet (25 mg total) by mouth every 6 (six) hours as needed for nausea or vomiting. Patient not taking: Reported on 09/25/2015 04/06/15   Charlestine Night, PA-C   BP 109/58 mmHg  Pulse 114  Temp(Src) 100.5 F (38.1 C) (Oral)  Resp 16  SpO2 100%  LMP 08/25/2015 Physical Exam  Constitutional: She appears well-developed and well-nourished. No distress.  HENT:  Head: Normocephalic and atraumatic.  Right Ear: External ear normal.  Left Ear: External ear normal.  Eyes: Conjunctivae are  normal. Right eye exhibits no discharge. Left eye exhibits no discharge. No scleral icterus.  Neck: Neck supple. No tracheal deviation present.  Cardiovascular: Normal rate, regular rhythm and intact distal pulses.   Pulmonary/Chest: Effort normal and breath sounds normal. No stridor. No respiratory distress. She has no wheezes. She has no rales.  Abdominal: Soft. Bowel sounds are normal. She exhibits no distension. There is no tenderness. There is no rebound and no guarding.  Genitourinary: Rectal exam shows tenderness. Rectal exam shows no external hemorrhoid, no fissure and anal tone normal.  Pain with internal exam, and of the soft tissue around the anus, no induration of the external tissue, no erythema  Musculoskeletal: She exhibits no  edema or tenderness.  Neurological: She is alert. She has normal strength. No cranial nerve deficit (no facial droop, extraocular movements intact, no slurred speech) or sensory deficit. She exhibits normal muscle tone. She displays no seizure activity. Coordination normal.  Skin: Skin is warm and dry. No rash noted.  Psychiatric: She has a normal mood and affect.  Nursing note and vitals reviewed.   ED Course  Procedures (including critical care time) Labs Review Labs Reviewed  COMPREHENSIVE METABOLIC PANEL - Abnormal; Notable for the following:    BUN 5 (*)    Calcium 8.8 (*)    Albumin 3.1 (*)    ALT 11 (*)    All other components within normal limits  CBC WITH DIFFERENTIAL/PLATELET - Abnormal; Notable for the following:    WBC 15.5 (*)    Platelets 429 (*)    Neutro Abs 12.7 (*)    All other components within normal limits  URINALYSIS, ROUTINE W REFLEX MICROSCOPIC (NOT AT [facility]) - Abnormal; Notable for the following:    Color, Urine AMBER (*)    APPearance CLOUDY (*)    Ketones, ur 15 (*)    Leukocytes, UA SMALL (*)    All other components within normal limits  HCG, QUANTITATIVE, PREGNANCY - Abnormal; Notable for the following:    hCG, Beta ChainMahalia Longest [id number] (*)    All other components within normal limits  URINE MICROSCOPIC-ADD ON - Abnormal; Notable for the following:    Squamous Epithelial / LPF 6-30 (*)    Bacteria, UA FEW (*)    All other components within normal limits  LIPASE, BLOOD    Imaging Review Mr Pelvis Wo Contrast  10/02/2015  CLINICAL DATA:  Rectal pain, history of Crohn's, [redacted] weeks pregnant EXAM: MRI PELVIS WITHOUT CONTRAST TECHNIQUE: Multiplanar multisequence MR imaging of the pelvis was performed. No intravenous contrast was administered. COMPARISON:  CT abdomen pelvis dated 06/30/2008 FINDINGS: Urinary Tract: Bladder is within normal limits. Bowel: Possible mild rectal wall thickening (series 6/ image 14). Adjacent 5.6 x 5.8 x 6.1 cm perirectal  abscess centered in the left ischiorectal fossa (series 6/ image 19). Although an anorectal fistula is suspected, this is not well visualized on unenhanced MRI. Vascular/Lymphatic: No suspicious pelvic lymphadenopathy. Reproductive: Gravid uterus. Placenta is posterior and covers the cervical os, although this is not unexpected at 10 weeks. Other: No pelvic ascites. Musculoskeletal: No focal osseous lesions. IMPRESSION: 6.1 cm perirectal abscess centered in the left ischiorectal fossa. Electronically Signed   By: Charline Bills M.D.   On: 10/02/2015 19:24   I have personally reviewed and evaluated these images and lab results as part of my medical decision-making.  Medications given in the ED Medications  sodium chloride 0.9 % bolus 1,000 mL (0 mLs Intravenous Stopped 10/02/15 1635)  And  0.9 %  sodium chloride infusion ( Intravenous New Bag/Given 10/02/15 1635)  piperacillin-tazobactam (ZOSYN) IVPB 3.375 g (not administered)  ondansetron (ZOFRAN) injection 4 mg (4 mg Intravenous Given 10/02/15 1532)  morphine 4 MG/ML injection 4 mg (4 mg Intravenous Given 10/02/15 1534)  morphine 4 MG/ML injection 4 mg (4 mg Intravenous Given 10/02/15 1802)      MDM   Final diagnoses:  Rectal pain  Crohn's disease (HCC)  Pregnancy    Pt has a large abscess on MRI.  Most likely related to her Crohns.  I have started her on Zosyn IV.  I discussed the case with Dr Abbey Chatters, General Surgery,  who will evaluate the patient in the ED.      Linwood Dibbles, MD 10/02/15 1943  Dr Abbey Chatters was able to drain the abscess in the ED.  Large amount of pus was drained and a penrose drain was placed.  Pt was given a dose of zosyn and IV fluids.  She will follow up in the office.  Linwood Dibbles, MD 10/02/15 2124

## 2015-10-02 NOTE — ED Notes (Signed)
Bladder scan showed of urine

## 2015-10-02 NOTE — ED Notes (Addendum)
Patient c/o nausea and rectal pain x 1 week. Patient states she has been taking sitz baths, using rectal cream that was ordered, and Tylenol. Patient states she found out yesterday that she was [redacted] weeks pregnant. Patient states she was diagnosed with internal hemorrhoids. Patient states she came to the ED due to worsening pain and difficulty urinating

## 2015-10-02 NOTE — Discharge Instructions (Signed)
Have a left perirectal abscess. The procedure  was called and incision and drainage. Please call if you have heavy bleeding from this area.  Tomorrow, start warm water soaks in the bathtub 3 times a day. It is okay if the drain falls out. Keep a heavy sanitary pad in your underwear to catch the drainage.  Call your gastroenterologist, let him/her know what was done and see if you can be seen in the next week or two. There is concern for Crohn's disease in your rectal area.  Call our office and arrange to be seen in 2 weeks. [phone]

## 2015-10-02 NOTE — ED Notes (Signed)
Patient transported to MRI 

## 2015-10-02 NOTE — ED Notes (Signed)
Patient returned from MRI.

## 2015-10-02 NOTE — ED Notes (Signed)
Patient updated on plan of care, pillow given. Patient is aware she is NPO. Mother remains at bedside.

## 2015-10-02 NOTE — Consult Note (Signed)
Reason for Consult: Perirectal abscess Referring Physician: Dr. Georgianne Adams is an 30 y.o. female.  HPI: She developed perirectal pain approximately 1-1/2-2 weeks ago.  She did not notice any swelling. She went to the emergency department 8 days ago because she was having nausea and vomiting and found out she was pregnant.She was given an appointment to see an obstetrician on July 24. Earlier this week, she saw her gastroenterologist at [facility]  (she has a history of Crohn's disease) because of increasing  Rectal pain. The pain got so bad today that she presented to the emergency department. An MRI demonstrated a 6 cm left ischiorectal fossa abscess.  I was asked to see her because of this. 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  Past Medical History  Diagnosis Date  . Diabetes mellitus without complication ([place])   . Blood transfusion without reported diagnosis   . Crohn's disease ([place])     History reviewed. No pertinent past surgical history.  Family History  Problem Relation Age of Onset  . Diabetes Mother   . Stroke Paternal Uncle   . Hypertension Maternal Grandmother   . Diabetes Maternal Grandmother   . Hypertension Paternal Grandmother     Social History:  reports that she has never smoked. She does not have any smokeless tobacco history on file. She reports that she does not drink alcohol or use illicit drugs.  Allergies:  Allergies  Allergen Reactions  . Contrast Media [Iodinated Diagnostic Agents] Hives    Prior to Admission medications   Medication Sig Start Date End Date Taking? Authorizing Provider  acetaminophen (TYLENOL) 500 MG tablet Take 1 tablet (500 mg total) by mouth every 4 (four) hours as needed. Take 2 tabs every 8 hours. Patient taking differently: Take 500 mg by mouth every 4 (four) hours as needed for mild pain, moderate pain or headache.  03/28/14  Yes Tereasa Coop, PA-C   amoxicillin-clavulanate (AUGMENTIN) 875-125 MG tablet Take 1 tablet by mouth 2 (two) times daily. 10/02/15   Jackolyn Confer, MD  HYDROcodone-acetaminophen (NORCO) 5-325 MG tablet Take 1-2 tablets by mouth every 4 (four) hours as needed for moderate pain or severe pain. 10/02/15   Jackolyn Confer, MD  metroNIDAZOLE (FLAGYL) 500 MG tablet Take 1 tablet (500 mg total) by mouth 3 (three) times daily. Patient not taking: Reported on 10/02/2015 09/25/15   Everlene Balls, MD  ondansetron (ZOFRAN ODT) 4 MG disintegrating tablet Take 1 tablet (4 mg total) by mouth every 8 (eight) hours as needed for nausea or vomiting. Patient not taking: Reported on 10/02/2015 09/25/15   Everlene Balls, MD  promethazine (PHENERGAN) 25 MG tablet Take 1 tablet (25 mg total) by mouth every 6 (six) hours as needed for nausea or vomiting. Patient not taking: Reported on 09/25/2015 04/06/15   Dalia Heading, PA-C     Results for orders placed or performed during the hospital encounter of 10/02/15 (from the past 48 hour(s))  Comprehensive metabolic panel     Status: Abnormal   Collection Time: 10/02/15  2:54 PM  Result Value Ref Range   Sodium 135 135 - 145 mmol/L   Potassium 3.9 3.5 - 5.1 mmol/L   Chloride 104 101 - 111 mmol/L   CO2 22 22 - 32 mmol/L   Glucose, Bld 90 65 - 99 mg/dL   BUN 5 (L) 6 - 20 mg/dL   Creatinine, Ser 0.84 0.44 - 1.00 mg/dL  Calcium 8.8 (L) 8.9 - 10.3 mg/dL   Total Protein 7.8 6.5 - 8.1 g/dL   Albumin 3.1 (L) 3.5 - 5.0 g/dL   AST 17 15 - 41 U/L   ALT 11 (L) 14 - 54 U/L   Alkaline Phosphatase 85 38 - 126 U/L   Total Bilirubin 1.1 0.3 - 1.2 mg/dL   GFR calc non Af Amer >60 >60 mL/min   GFR calc Af Amer >60 >60 mL/min    Comment: (NOTE) The eGFR has been calculated using the CKD EPI equation. This calculation has not been validated in all clinical situations. eGFR's persistently <60 mL/min signify possible Chronic Kidney Disease.    Anion gap 9 5 - 15  Lipase, blood     Status: None    Collection Time: 10/02/15  2:54 PM  Result Value Ref Range   Lipase 14 11 - 51 U/L  CBC WITH DIFFERENTIAL     Status: Abnormal   Collection Time: 10/02/15  2:54 PM  Result Value Ref Range   WBC 15.5 (H) 4.0 - 10.5 K/uL   RBC 4.20 3.87 - 5.11 MIL/uL   Hemoglobin 12.1 12.0 - 15.0 g/dL   HCT 36.0 36.0 - 46.0 %   MCV 85.7 78.0 - 100.0 fL   MCH 28.8 26.0 - 34.0 pg   MCHC 33.6 30.0 - 36.0 g/dL   RDW 13.8 11.5 - 15.5 %   Platelets 429 (H) 150 - 400 K/uL   Neutrophils Relative % 82 %   Neutro Abs 12.7 (H) 1.7 - 7.7 K/uL   Lymphocytes Relative 12 %   Lymphs Abs 1.8 0.7 - 4.0 K/uL   Monocytes Relative 6 %   Monocytes Absolute 0.9 0.1 - 1.0 K/uL   Eosinophils Relative 0 %   Eosinophils Absolute 0.0 0.0 - 0.7 K/uL   Basophils Relative 0 %   Basophils Absolute 0.0 0.0 - 0.1 K/uL  hCG, quantitative, pregnancy     Status: Abnormal   Collection Time: 10/02/15  2:54 PM  Result Value Ref Range   hCG, Beta Chain, Quant, S 204590 (H) <5 mIU/mL    Comment:          GEST. AGE      CONC.  (mIU/mL)   <=1 WEEK        5 - 50     2 WEEKS       50 - 500     3 WEEKS       100 - 10,000     4 WEEKS     1,000 - 30,000     5 WEEKS     3,500 - 115,000   6-8 WEEKS     12,000 - 270,000    12 WEEKS     15,000 - 220,000        FEMALE AND NON-PREGNANT FEMALE:     LESS THAN 5 mIU/mL   Urinalysis, Routine w reflex microscopic (not at [facility])     Status: Abnormal   Collection Time: 10/02/15  4:42 PM  Result Value Ref Range   Color, Urine AMBER (A) YELLOW    Comment: BIOCHEMICALS MAY BE AFFECTED BY COLOR   APPearance CLOUDY (A) CLEAR   Specific Gravity, Urine 1.021 1.005 - 1.030   pH 5.5 5.0 - 8.0   Glucose, UA NEGATIVE NEGATIVE mg/dL   Hgb urine dipstick NEGATIVE NEGATIVE   Bilirubin Urine NEGATIVE NEGATIVE   Ketones, ur 15 (A) NEGATIVE mg/dL   Protein, ur NEGATIVE NEGATIVE mg/dL  Nitrite NEGATIVE NEGATIVE   Leukocytes, UA SMALL (A) NEGATIVE  Urine microscopic-add on     Status: Abnormal   Collection  Time: 10/02/15  4:42 PM  Result Value Ref Range   Squamous Epithelial / LPF 6-30 (A) NONE SEEN   WBC, UA 0-5 0 - 5 WBC/hpf   RBC / HPF NONE SEEN 0 - 5 RBC/hpf   Bacteria, UA FEW (A) NONE SEEN    Mr Pelvis Wo Contrast  10/02/2015  CLINICAL DATA:  Rectal pain, history of Crohn's, [redacted] weeks pregnant EXAM: MRI PELVIS WITHOUT CONTRAST TECHNIQUE: Multiplanar multisequence MR imaging of the pelvis was performed. No intravenous contrast was administered. COMPARISON:  CT abdomen pelvis dated 06/30/2008 FINDINGS: Urinary Tract: Bladder is within normal limits. Bowel: Possible mild rectal wall thickening (series 6/ image 14). Adjacent 5.6 x 5.8 x 6.1 cm perirectal abscess centered in the left ischiorectal fossa (series 6/ image 19). Although an anorectal fistula is suspected, this is not well visualized on unenhanced MRI. Vascular/Lymphatic: No suspicious pelvic lymphadenopathy. Reproductive: Gravid uterus. Placenta is posterior and covers the cervical os, although this is not unexpected at 10 weeks. Other: No pelvic ascites. Musculoskeletal: No focal osseous lesions. IMPRESSION: 6.1 cm perirectal abscess centered in the left ischiorectal fossa. Electronically Signed   By: Julian Hy M.D.   On: 10/02/2015 19:24    Review of Systems  Constitutional: Negative for fever and chills.  Gastrointestinal: Positive for nausea, vomiting and blood in stool. Negative for abdominal pain.   Blood pressure 109/58, pulse 114, temperature 100.5 F (38.1 C), temperature source Oral, resp. rate 16, last menstrual period 08/25/2015, SpO2 100 %. Physical Exam  Constitutional: She appears well-developed and well-nourished. No distress.  HENT:  Head: Normocephalic and atraumatic.  Genitourinary:  There is some subtle swelling in the left anoderm area in the 5:00 position that is tender.  Skin: Skin is warm and dry.  Psychiatric: She has a normal mood and affect. Her behavior is normal.    Assessment/Plan: 1. Left  peri-rectal abscess in a patient with Crohn's disease.  2. 10 week intrauterine pregnancy.  Plan: Bedside incision and drainage of perirectal abscess. The procedure and risks were explained to her. Aftercare was explained to her. She agrees with the plan. After the procedure,  A dose of IV Zosyn, and IV fluid hydration, she should be able to be discharged to home On oral Augmentin and  Norco for pain.   She will need to follow up with her gastroenterologist next week and follow up with [place] in 2 weeks.  Marissa Adams J 10/02/2015, 9:19 PM

## 2015-10-02 NOTE — ED Notes (Signed)
Patient to remain NPO at this time per MD. Will update patient.

## 2015-10-02 NOTE — ED Notes (Signed)
MD at bedside for procedure at this time.

## 2015-10-02 NOTE — Op Note (Signed)
Operative Note  RUVI CRIVELLI female 30 y.o. 10/02/2015  PREOPERATIVE DX:  Perirectal abscess  POSTOPERATIVE DX:  Same  PROCEDURE:   Incision and drainage of perirectal abscess         Surgeon: Avel Peace J   Anesthesia: Local anesthesia 2% plain Xylocaine    Procedure Detail:  She was placed in the left lateral decubitus position and left perianal area was sterilely prepped and anesthetized with Xylocaine. A cruciate incision was made in the anoderm. Using a 19-gauge needle and a 10 cc syringe, I sequentially aspirated 42 cc of pus. The track was then dilated and the cavity entered with hemostat. I placed the Penrose drain in the perirectal abscess cavity. A bulky dressing was applied. She tolerated the procedure well. There were no apparent complications.

## 2015-10-02 NOTE — ED Notes (Signed)
Patient's mother taking patient's purse and cell phone at this time.

## 2015-10-03 MED ORDER — PROMETHAZINE HCL 25 MG PO TABS
25.0000 mg | ORAL_TABLET | Freq: Once | ORAL | Status: AC
  Administered 2015-10-03: 25 mg via ORAL
  Filled 2015-10-03: qty 1

## 2015-10-03 MED ORDER — PROMETHAZINE HCL 25 MG PO TABS: 25.0000 mg | ORAL_TABLET | Freq: Four times a day (QID) | ORAL | Status: DC | PRN

## 2015-10-03 NOTE — ED Provider Notes (Signed)
Asked by nurse to prescribe nausea medicine for patient. She states that she is quite nauseated and concerns she will be throwing up at home. Discussed possible adverse effects given that she is [redacted] weeks pregnant. She understands these and still wants medication. Will prescribe phenergan.  Pricilla Loveless, MD 10/03/15 [phone]

## 2015-10-04 LAB — OB RESULTS CONSOLE ABO/RH: RH TYPE: POSITIVE

## 2015-10-04 LAB — OB RESULTS CONSOLE GC/CHLAMYDIA
CHLAMYDIA, DNA PROBE: NEGATIVE
GC PROBE AMP, GENITAL: NEGATIVE

## 2015-10-04 LAB — OB RESULTS CONSOLE RPR: RPR: NONREACTIVE

## 2015-10-04 LAB — OB RESULTS CONSOLE HEPATITIS B SURFACE ANTIGEN: HEP B S AG: NEGATIVE

## 2015-10-04 LAB — OB RESULTS CONSOLE RUBELLA ANTIBODY, IGM: Rubella: IMMUNE

## 2015-10-04 LAB — OB RESULTS CONSOLE HIV ANTIBODY (ROUTINE TESTING): HIV: NONREACTIVE

## 2015-10-04 LAB — OB RESULTS CONSOLE ANTIBODY SCREEN: ANTIBODY SCREEN: NEGATIVE

## 2015-10-07 ENCOUNTER — Inpatient Hospital Stay (HOSPITAL_COMMUNITY)
Admission: AD | Admit: 2015-10-07 | Discharge: 2015-10-07 | Disposition: A | Discharge status: Home / Self Care | Payer: BLUE CROSS/BLUE SHIELD | Source: Ambulatory Visit | Attending: Obstetrics and Gynecology | Admitting: Obstetrics and Gynecology

## 2015-10-07 ENCOUNTER — Encounter (HOSPITAL_COMMUNITY): Payer: Self-pay | Admitting: *Deleted

## 2015-10-07 DIAGNOSIS — K117 Disturbances of salivary secretion: Secondary | ICD-10-CM | POA: Diagnosis not present

## 2015-10-07 DIAGNOSIS — Z3A10 10 weeks gestation of pregnancy: Secondary | ICD-10-CM | POA: Diagnosis not present

## 2015-10-07 DIAGNOSIS — O24911 Unspecified diabetes mellitus in pregnancy, first trimester: Secondary | ICD-10-CM | POA: Insufficient documentation

## 2015-10-07 DIAGNOSIS — O219 Vomiting of pregnancy, unspecified: Secondary | ICD-10-CM

## 2015-10-07 DIAGNOSIS — O99611 Diseases of the digestive system complicating pregnancy, first trimester: Secondary | ICD-10-CM | POA: Insufficient documentation

## 2015-10-07 LAB — URINALYSIS, ROUTINE W REFLEX MICROSCOPIC
Bilirubin Urine: NEGATIVE
Glucose, UA: NEGATIVE mg/dL
Hgb urine dipstick: NEGATIVE
Ketones, ur: 15 mg/dL — AB
LEUKOCYTES UA: NEGATIVE
NITRITE: NEGATIVE
PH: 5.5 (ref 5.0–8.0)
Protein, ur: NEGATIVE mg/dL
SPECIFIC GRAVITY, URINE: 1.02 (ref 1.005–1.030)

## 2015-10-07 MED ORDER — PROMETHAZINE HCL 25 MG/ML IJ SOLN
25.0000 mg | Freq: Once | INTRAMUSCULAR | Status: AC
  Administered 2015-10-07: 25 mg via INTRAVENOUS
  Filled 2015-10-07: qty 1

## 2015-10-07 MED ORDER — GLYCOPYRROLATE 1 MG PO TABS: 1.0000 mg | ORAL_TABLET | Freq: Three times a day (TID) | ORAL | 1 refills | Status: DC

## 2015-10-07 MED ORDER — GLYCOPYRROLATE 0.2 MG/ML IJ SOLN
0.1000 mg | Freq: Once | INTRAMUSCULAR | Status: AC
  Administered 2015-10-07: 0.1 mg via INTRAVENOUS
  Filled 2015-10-07: qty 0.5

## 2015-10-07 MED ORDER — PROMETHAZINE HCL 25 MG PO TABS: 12.5000 mg | ORAL_TABLET | Freq: Four times a day (QID) | ORAL | 0 refills | Status: DC | PRN

## 2015-10-07 NOTE — MAU Note (Signed)
Pt C/O vomiting for the past 2 days, unable to keep liquids down, no appetite.  Has been taking antibiotics for abscess, unable to hold this down.  Also urinary issues, having pressure & urgency, denies burning.  Spitting in triage.  Denies abd pain, no vaginal bleeding, does have small amount of rectal bleeding from abscess.

## 2015-10-07 NOTE — Discharge Instructions (Signed)

## 2015-10-07 NOTE — MAU Provider Note (Signed)
History     CSN: [id number]  Arrival date and time: 10/07/15 1549   First Provider Initiated Contact with Patient 10/07/15 2031      Chief Complaint  Patient presents with  . Emesis  . Urinary Frequency   Marissa Adams is  30 y.o. G1P0 at [redacted]w[redacted]d who presents today with vomiting, and dysuria. She has also noticed that the peri-rectal abscess she had drained over the weekend is now "leaking". She denies any pain at the site.    Emesis   This is a new problem. The current episode started in the past 7 days. The problem occurs 2 to 4 times per day. The problem has been gradually worsening. The emesis has an appearance of stomach contents. There has been no fever. Pertinent negatives include no abdominal pain, chills, diarrhea or fever. Risk factors: pregnancy  Treatments tried: Phenergan, but she has not taken it in a few days.  The treatment provided no relief.  Urinary Frequency   This is a new problem. The current episode started in the past 7 days. The problem has been gradually worsening. The pain is at a severity of 1/10. There has been no fever. She is sexually active. Associated symptoms include frequency, nausea, urgency and vomiting. Pertinent negatives include no chills. She has tried nothing for the symptoms. recently had perirectal abscess drained.     Past Medical History:  Diagnosis Date  . Blood transfusion without reported diagnosis   . Crohn's disease (HCC)   . Diabetes mellitus without complication (HCC)     No past surgical history on file.  Family History  Problem Relation Age of Onset  . Diabetes Mother   . Stroke Paternal Uncle   . Hypertension Maternal Grandmother   . Diabetes Maternal Grandmother   . Hypertension Paternal Grandmother     Social History  Substance Use Topics  . Smoking status: Never Smoker  . Smokeless tobacco: Not on file  . Alcohol use No    Allergies:  Allergies  Allergen Reactions  . Contrast Media [Iodinated Diagnostic  Agents] Hives    Prescriptions Prior to Admission  Medication Sig Dispense Refill Last Dose  . amoxicillin-clavulanate (AUGMENTIN) 875-125 MG tablet Take 1 tablet by mouth 2 (two) times daily. 14 tablet 0   . HYDROcodone-acetaminophen (NORCO) 5-325 MG tablet Take 1-2 tablets by mouth every 4 (four) hours as needed for moderate pain or severe pain. 30 tablet 0   . ondansetron (ZOFRAN ODT) 4 MG disintegrating tablet Take 1 tablet (4 mg total) by mouth every 8 (eight) hours as needed for nausea or vomiting. (Patient not taking: Reported on 10/02/2015) 12 tablet 0 Completed Course at Unknown time  . promethazine (PHENERGAN) 25 MG tablet Take 1 tablet (25 mg total) by mouth every 6 (six) hours as needed for nausea or vomiting. 10 tablet 0     Review of Systems  Constitutional: Negative for chills and fever.  Gastrointestinal: Positive for nausea and vomiting. Negative for abdominal pain, constipation and diarrhea.  Genitourinary: Positive for dysuria, frequency and urgency.   Physical Exam   Blood pressure (!) 105/43, pulse (!) 133, temperature 99.4 F (37.4 C), temperature source Oral, resp. rate 16, height 5\' 4"  (1.626 m), weight 151 lb (68.5 kg), last menstrual period 07/26/2015.  Physical Exam  Nursing note and vitals reviewed. Constitutional: She is oriented to person, place, and time. She appears well-developed and well-nourished. No distress.  HENT:  Head: Normocephalic.  Cardiovascular: Normal rate.   Respiratory: Effort  normal.  GI: There is no tenderness. There is no rebound.  Genitourinary:  Genitourinary Comments: Rectal abscess with a small amount of drainage still present.  Neurological: She is alert and oriented to person, place, and time.  Skin: Skin is warm and dry.  Psychiatric: She has a normal mood and affect.    MAU Course  Procedures  MDM Patient has had D5LR with phenergan and robinul  2210: Patient has been tolerating PO at this time. 2230: Left message  with Dr. Su Hilt to call the unit.  2241: Patient is ready for DC home. No return call from Dr. Su Hilt. Will DC home.   Assessment and Plan   1. Ptyalism   2. Nausea and vomiting during pregnancy prior to [redacted] weeks gestation   3. [redacted] weeks gestation of pregnancy    DC home Comfort measures reviewed  RX: phenergan PRN #30, robinul TID #90 Return to MAU as needed FU with OB as planned  Follow-up Information    [facility] .   Specialty:  Obstetrics and Gynecology Why:  as scheduled  Contact information: [address] [place] [address] [phone]           Tawnya Crook 10/07/2015, 8:33 PM

## 2015-10-22 ENCOUNTER — Other Ambulatory Visit: Payer: Self-pay | Admitting: Advanced Practice Midwife

## 2015-10-24 ENCOUNTER — Inpatient Hospital Stay (HOSPITAL_COMMUNITY)
Admission: AD | Admit: 2015-10-24 | Discharge: 2015-10-24 | Disposition: A | Discharge status: Home / Self Care | Payer: BLUE CROSS/BLUE SHIELD | Source: Ambulatory Visit | Attending: Obstetrics and Gynecology | Admitting: Obstetrics and Gynecology

## 2015-10-24 ENCOUNTER — Other Ambulatory Visit: Payer: Self-pay | Admitting: Obstetrics and Gynecology

## 2015-10-24 ENCOUNTER — Encounter (HOSPITAL_COMMUNITY): Payer: Self-pay | Admitting: Certified Nurse Midwife

## 2015-10-24 DIAGNOSIS — O99611 Diseases of the digestive system complicating pregnancy, first trimester: Secondary | ICD-10-CM | POA: Diagnosis not present

## 2015-10-24 DIAGNOSIS — K5090 Crohn's disease, unspecified, without complications: Secondary | ICD-10-CM | POA: Diagnosis not present

## 2015-10-24 DIAGNOSIS — O219 Vomiting of pregnancy, unspecified: Secondary | ICD-10-CM | POA: Diagnosis not present

## 2015-10-24 DIAGNOSIS — O24911 Unspecified diabetes mellitus in pregnancy, first trimester: Secondary | ICD-10-CM | POA: Insufficient documentation

## 2015-10-24 DIAGNOSIS — O210 Mild hyperemesis gravidarum: Secondary | ICD-10-CM | POA: Diagnosis present

## 2015-10-24 DIAGNOSIS — Z3A12 12 weeks gestation of pregnancy: Secondary | ICD-10-CM | POA: Diagnosis not present

## 2015-10-24 LAB — URINALYSIS, ROUTINE W REFLEX MICROSCOPIC
GLUCOSE, UA: NEGATIVE mg/dL
HGB URINE DIPSTICK: NEGATIVE
KETONES UR: NEGATIVE mg/dL
Nitrite: NEGATIVE
PH: 6 (ref 5.0–8.0)
Protein, ur: NEGATIVE mg/dL
Specific Gravity, Urine: 1.02 (ref 1.005–1.030)

## 2015-10-24 LAB — URINE MICROSCOPIC-ADD ON: RBC / HPF: NONE SEEN RBC/hpf (ref 0–5)

## 2015-10-24 MED ORDER — FAMOTIDINE IN NACL 20-0.9 MG/50ML-% IV SOLN
20.0000 mg | Freq: Once | INTRAVENOUS | Status: AC
  Administered 2015-10-24: 20 mg via INTRAVENOUS
  Filled 2015-10-24: qty 50

## 2015-10-24 MED ORDER — RANITIDINE HCL 150 MG PO TABS: 150.0000 mg | ORAL_TABLET | Freq: Two times a day (BID) | ORAL | 1 refills | Status: DC

## 2015-10-24 MED ORDER — METOCLOPRAMIDE HCL 10 MG PO TABS: 10.0000 mg | ORAL_TABLET | Freq: Three times a day (TID) | ORAL | 1 refills | Status: DC

## 2015-10-24 MED ORDER — LACTATED RINGERS IV BOLUS (SEPSIS)
1000.0000 mL | Freq: Once | INTRAVENOUS | Status: AC
  Administered 2015-10-24: 1000 mL via INTRAVENOUS

## 2015-10-24 MED ORDER — METOCLOPRAMIDE HCL 5 MG/ML IJ SOLN
10.0000 mg | Freq: Once | INTRAMUSCULAR | Status: AC
  Administered 2015-10-24: 10 mg via INTRAVENOUS
  Filled 2015-10-24: qty 2

## 2015-10-24 MED ORDER — PROMETHAZINE HCL 12.5 MG PO TABS: 12.5000 mg | ORAL_TABLET | Freq: Every evening | ORAL | 0 refills | Status: DC | PRN

## 2015-10-24 NOTE — MAU Provider Note (Signed)
History     CSN: [id number]  Arrival date and time: 10/24/15 1022   First Provider Initiated Contact with Patient 10/24/15 1056      Chief Complaint  Patient presents with  . Morning Sickness   HPI   Ms.Marissa Adams is a 30 y.o. female G1P0 @ [redacted]w[redacted]d here with spitting and nausea/vomting. She has vomited 2 times this morning and usually vomits 4-5 times her day. She was given phenergan for the symptoms and was taking that for a while and it worked, however she feels recently it stopped work. She hasn't taken it in a week.  OB History    Gravida Para Term Preterm AB Living   1             SAB TAB Ectopic Multiple Live Births                  Past Medical History:  Diagnosis Date  . Blood transfusion without reported diagnosis   . Crohn's disease (HCC)   . Diabetes mellitus without complication (HCC)     History reviewed. No pertinent surgical history.  Family History  Problem Relation Age of Onset  . Diabetes Mother   . Stroke Paternal Uncle   . Hypertension Maternal Grandmother   . Diabetes Maternal Grandmother   . Hypertension Paternal Grandmother     Social History  Substance Use Topics  . Smoking status: Never Smoker  . Smokeless tobacco: Never Used  . Alcohol use No    Allergies:  Allergies  Allergen Reactions  . Contrast Media [Iodinated Diagnostic Agents] Hives    Prescriptions Prior to Admission  Medication Sig Dispense Refill Last Dose  . acetaminophen (TYLENOL) 500 MG tablet Take 1,000 mg by mouth every 6 (six) hours as needed for mild pain, moderate pain or headache.   10/23/2015 at 2300  . HYDROcodone-acetaminophen (NORCO) 5-325 MG tablet Take 1-2 tablets by mouth every 4 (four) hours as needed for moderate pain or severe pain. 30 tablet 0 Past Month at Unknown time  . ondansetron (ZOFRAN ODT) 4 MG disintegrating tablet Take 1 tablet (4 mg total) by mouth every 8 (eight) hours as needed for nausea or vomiting. 12 tablet 0 Past Month at Unknown  time  . promethazine (PHENERGAN) 25 MG tablet Take 12.5-25 mg by mouth every 6 (six) hours as needed for nausea or vomiting.   Past Week at Unknown time  . amoxicillin-clavulanate (AUGMENTIN) 875-125 MG tablet Take 1 tablet by mouth 2 (two) times daily. (Patient not taking: Reported on 10/24/2015) 14 tablet 0   . glycopyrrolate (ROBINUL) 1 MG tablet Take 1 tablet (1 mg total) by mouth 3 (three) times daily. (Patient not taking: Reported on 10/24/2015) 90 tablet 1    Results for orders placed or performed during the hospital encounter of 10/24/15 (from the past 48 hour(s))  Urinalysis, Routine w reflex microscopic (not at [facility])     Status: Abnormal   Collection Time: 10/24/15 10:35 AM  Result Value Ref Range   Color, Urine ORANGE (A) YELLOW    Comment: BIOCHEMICALS MAY BE AFFECTED BY COLOR   APPearance HAZY (A) CLEAR   Specific Gravity, Urine 1.020 1.005 - 1.030   pH 6.0 5.0 - 8.0   Glucose, UA NEGATIVE NEGATIVE mg/dL   Hgb urine dipstick NEGATIVE NEGATIVE   Bilirubin Urine SMALL (A) NEGATIVE   Ketones, ur NEGATIVE NEGATIVE mg/dL   Protein, ur NEGATIVE NEGATIVE mg/dL   Nitrite NEGATIVE NEGATIVE   Leukocytes, UA SMALL (A)  NEGATIVE  Urine microscopic-add on     Status: Abnormal   Collection Time: 10/24/15 10:35 AM  Result Value Ref Range   Squamous Epithelial / LPF 0-5 (A) NONE SEEN   WBC, UA 0-5 0 - 5 WBC/hpf   RBC / HPF NONE SEEN 0 - 5 RBC/hpf   Bacteria, UA FEW (A) NONE SEEN     Review of Systems  Constitutional: Negative for chills and fever.  Gastrointestinal: Positive for heartburn, nausea and vomiting.   Physical Exam   Blood pressure (!) 91/54, pulse 107, temperature 98.6 F (37 C), temperature source Oral, resp. rate 20, weight 149 lb 9.6 oz (67.9 kg), last menstrual period 07/26/2015, SpO2 98 %.  Physical Exam  Constitutional: She is oriented to person, place, and time. She appears well-developed and well-nourished. No distress.  HENT:  Head: Normocephalic.  Eyes:  Pupils are equal, round, and reactive to light.  Respiratory: Effort normal.  Musculoskeletal: Normal range of motion.  Neurological: She is alert and oriented to person, place, and time.  Skin: Skin is warm. She is not diaphoretic.  Psychiatric: Her behavior is normal.    MAU Course  Procedures  None  MDM  LR bolus Reglan 10 mg PO Pepcid 20 mg PO + fetal hear tones via doppler  Discussed patient with Dr. Stefano Gaul.   Assessment and Plan   A:    ICD-9-CM ICD-10-CM   1. Nausea and vomiting in pregnancy 643.90 O21.9     P:  Discharge home in stable condition Rx: Reglan TID, Phenergan at bedtime. Discussed the importance of not taking these two medications at the same time.        Zantac  Return to MAU if symptoms worsen Small, frequent meals Increase PO fluid intake Follow up with [facility] as scheduled    Duane Lope, NP 10/24/2015 2:26 PM

## 2015-10-24 NOTE — Discharge Instructions (Signed)

## 2015-11-02 ENCOUNTER — Ambulatory Visit: Payer: Self-pay | Admitting: Surgery

## 2015-11-02 ENCOUNTER — Encounter: Payer: Self-pay | Admitting: Surgery

## 2015-11-18 DIAGNOSIS — K610 Anal abscess: Secondary | ICD-10-CM | POA: Insufficient documentation

## 2016-03-13 NOTE — L&D Delivery Note (Signed)
Delivery Note At 3:10 PM a viable female, "Anette Riedel", was delivered via Vaginal, Spontaneous Delivery (Presentation: LOA ).  APGAR: 8, 9; weight TBD  Placenta status: Spontaneous, intact, .  Cord: CAN x 2, loose, reduced over vtx at delivery, with the following complications: None .  Cord pH: NA  Anesthesia:  Epidural, Local for repair Episiotomy: None Lacerations: 2nd degree;Perineal. Suture Repair: 3.0 vicryl Est. Blood Loss (mL): 100  Pre-existing abscess on left buttocks examined after delivery.  Minimal drainage, no evidence cellulitis.  Prior dressing removed, area cleansed, and new dressing applied to area.  Patient to notify staff/providers if area becomes more painful.  She will perform dressing changes as needed.  Mom to postpartum.  Baby to Couplet care / Skin to Skin. Patient plans outpatient circumcision.  She is currently undecided about contraception.  Nigel Bridgeman 04/26/2016, 3:40 PM

## 2016-04-04 LAB — OB RESULTS CONSOLE GBS: STREP GROUP B AG: NEGATIVE

## 2016-04-25 ENCOUNTER — Encounter (HOSPITAL_COMMUNITY): Payer: Self-pay | Admitting: *Deleted

## 2016-04-25 ENCOUNTER — Telehealth (HOSPITAL_COMMUNITY): Payer: Self-pay | Admitting: *Deleted

## 2016-04-25 ENCOUNTER — Encounter (HOSPITAL_COMMUNITY): Payer: Self-pay

## 2016-04-25 ENCOUNTER — Other Ambulatory Visit: Payer: Self-pay | Admitting: Obstetrics and Gynecology

## 2016-04-25 DIAGNOSIS — L0231 Cutaneous abscess of buttock: Secondary | ICD-10-CM | POA: Diagnosis present

## 2016-04-25 DIAGNOSIS — D573 Sickle-cell trait: Secondary | ICD-10-CM | POA: Diagnosis present

## 2016-04-25 NOTE — Telephone Encounter (Signed)
Preadmission screenPreadmission screen 

## 2016-04-25 NOTE — H&P (Signed)
Marissa Adams is a 31 y.o. female, G3P1011 at 46 2/7 weeks, presenting for induction due to Crohn's disease, with current rectal/buttocks abscess.  Cervix was 2, 60%, in the office on 2/12.  Per consult with Dr. Sallye Ober, the recommendation had been made for induction after 39 weeks due to Crohn's dx.  Patient denies leaking, bleeding, HA, and reports rectal/buttocks abscess is draining, but stable.  Patient Active Problem List   Diagnosis Date Noted  . Crohn's disease of rectum with abscess (HCC) 04/26/2016  . Sickle cell trait (HCC) 04/25/2016  . Abscess of buttock, left 04/25/2016  . CD (Crohn's disease) (HCC) 12/08/2011    History of present pregnancy: Patient entered care at 10 weeks.   EDC of 05/01/16 was established by LMP and congruent with [place] at 19 weeks   Anatomy scan:  19 3/7weeks, with limited findings and a posterior, low-lying placenta. EFW 47%ile.  Additional [place] evaluations:   23 weeks--Completion of anatomy, placenta posterior and no longer LL, EFW 26%ile, BPB and HC < 6%ile, normal fluid. 26 weeks--EFW 45%ile, all growth parameters normal 36 weeks--Vtx, AFI 55%ile, EFW 32%ile, 5+14. Significant prenatal events:   Declined genetic testing Followed during pregnancy at [facility], Dr. Carlynn Herald, for Crohn's dx and recurrent abscess of rectum/buttocks.  Started on Remicade during pregnancy.  Last evaluation:  04/24/16--cervix 2 cm, soft, 60%, vtx, -2.  Normotensive.  Abscess on left buttock had improved, although still draining spontaneously.  OB History    Gravida Para Term Preterm AB Living   3 1 1   1 1    SAB TAB Ectopic Multiple Live Births   1       1    2012--39 weeks, SVB, induced due to Crohn's dx, female, 64+8, delivered in [place]. 2015--SAB 1st trimester  Past Medical History:  Diagnosis Date  . Blood transfusion without reported diagnosis    Crohn's Disease  . Crohn's disease (HCC)   . Diabetes mellitus without complication (HCC)    drug induced  diabetes  . Vaginal Pap smear, abnormal   . Vitamin D deficiency    Surgical Hx:  I&D of rectal abscesses; bunion removal 2002  Family History: family history includes Diabetes in her maternal grandmother and mother; Hypertension in her maternal grandmother and paternal grandmother; Stroke in her paternal uncle.   Social History:  reports that she has never smoked. She has never used smokeless tobacco. She reports that she uses drugs, including Hydrocodone. She reports that she does not drink alcohol.  Patient is African-American, of the [place] faith, married to [place]itt, who is involved and supportive.  Patient has 2 years of college and is employed.   Prenatal Transfer Tool  Maternal Diabetes: No Genetic Screening: Declined Maternal Ultrasounds/Referrals: Normal Fetal Ultrasounds or other Referrals:  None Maternal Substance Abuse:  No Significant Maternal Medications:  Meds include: Other: Remicade Significant Maternal Lab Results: Lab values include: Group B Strep negative  TDAP Declined Flu 01/03/16  ROS: Occasional cramping, + FM, draining abscess on left buttocks.  Allergies  Allergen Reactions  . Contrast Media [Iodinated Diagnostic Agents] Hives     Dilation: 2 Effacement (%): 60 Station: -2 Blood pressure 124/77, pulse 79, resp. rate 18, last menstrual period 07/26/2015.  Chest clear Heart RRR without murmur Abd gravid, NT, FH 39 cm Pelvic: 2 cm, 60%, vtx, -2 Ext: WNL  FHR: Category  UCs: Irregular, mild  Prenatal labs: ABO, Rh: B/Positive/-- (07/24 0000)B+ Antibody: Negative (07/24 0000)Neg Rubella:  Immune RPR: Nonreactive (07/24  0000) NR HBsAg: Negative (07/24 0000) Neg HIV: Non-reactive (07/24 0000) NR GBS: Negative (01/23 0000)Negative 04/04/16 Sickle cell/Hgb electrophoresis:  AS Pap:  10/12/15 WNL GC:  Negative 10/13/15 Chlamydia:  Negative 10/13/15 Genetic screenings:  Declined Glucola: WNL Other:   Hgb 11.4 at NOB, 11.8 at 28 weeks        Assessment/Plan: IUP at 39 2/7 weeks--for induction Crohn's dx, with buttocks abscess [place] trait, FOB negative   Plan: Admit to [facility] per consult with Dr. Su Hilt Routine [facility] orders  Risks and benefits of induction were reviewed, including failure of method, prolonged labor, need for further intervention, risk of cesarean.  Patient and family seem to understand these risks and wish to proceed. Options of cytotech, foley bulb, AROM, and pitocin reviewed, with use of each discussed. Will proceed with pitocin, then AROM as labor advances. Patient now does NOT desire PP BTL.  Nyra Capes, [place] 04/26/2016, 7:44 AM

## 2016-04-26 ENCOUNTER — Inpatient Hospital Stay (HOSPITAL_COMMUNITY)
Admission: RE | Admit: 2016-04-26 | Discharge: 2016-04-28 | DRG: 775 | Disposition: A | Discharge status: Home / Self Care | Payer: BLUE CROSS/BLUE SHIELD | Source: Ambulatory Visit | Attending: Obstetrics and Gynecology | Admitting: Obstetrics and Gynecology

## 2016-04-26 ENCOUNTER — Inpatient Hospital Stay (HOSPITAL_COMMUNITY): Payer: BLUE CROSS/BLUE SHIELD | Admitting: Anesthesiology

## 2016-04-26 ENCOUNTER — Encounter (HOSPITAL_COMMUNITY): Payer: Self-pay

## 2016-04-26 DIAGNOSIS — Z8249 Family history of ischemic heart disease and other diseases of the circulatory system: Secondary | ICD-10-CM

## 2016-04-26 DIAGNOSIS — D573 Sickle-cell trait: Secondary | ICD-10-CM | POA: Diagnosis present

## 2016-04-26 DIAGNOSIS — Z3A39 39 weeks gestation of pregnancy: Secondary | ICD-10-CM

## 2016-04-26 DIAGNOSIS — O9972 Diseases of the skin and subcutaneous tissue complicating childbirth: Secondary | ICD-10-CM | POA: Diagnosis present

## 2016-04-26 DIAGNOSIS — O9902 Anemia complicating childbirth: Secondary | ICD-10-CM | POA: Diagnosis present

## 2016-04-26 DIAGNOSIS — K5090 Crohn's disease, unspecified, without complications: Secondary | ICD-10-CM | POA: Diagnosis present

## 2016-04-26 DIAGNOSIS — Z823 Family history of stroke: Secondary | ICD-10-CM

## 2016-04-26 DIAGNOSIS — L0231 Cutaneous abscess of buttock: Secondary | ICD-10-CM | POA: Diagnosis present

## 2016-04-26 DIAGNOSIS — O9962 Diseases of the digestive system complicating childbirth: Principal | ICD-10-CM | POA: Diagnosis present

## 2016-04-26 DIAGNOSIS — Z833 Family history of diabetes mellitus: Secondary | ICD-10-CM | POA: Diagnosis not present

## 2016-04-26 DIAGNOSIS — K50114 Crohn's disease of large intestine with abscess: Secondary | ICD-10-CM | POA: Diagnosis present

## 2016-04-26 LAB — CBC
HEMATOCRIT: 35.2 % — AB (ref 36.0–46.0)
Hemoglobin: 12.1 g/dL (ref 12.0–15.0)
MCH: 28.9 pg (ref 26.0–34.0)
MCHC: 34.4 g/dL (ref 30.0–36.0)
MCV: 84 fL (ref 78.0–100.0)
PLATELETS: 345 10*3/uL (ref 150–400)
RBC: 4.19 MIL/uL (ref 3.87–5.11)
RDW: 14.7 % (ref 11.5–15.5)
WBC: 8.4 10*3/uL (ref 4.0–10.5)

## 2016-04-26 LAB — TYPE AND SCREEN
ABO/RH(D): B POS
Antibody Screen: NEGATIVE

## 2016-04-26 LAB — ABO/RH: ABO/RH(D): B POS

## 2016-04-26 LAB — RPR: RPR Ser Ql: NONREACTIVE

## 2016-04-26 MED ORDER — PRENATAL MULTIVITAMIN CH
1.0000 | ORAL_TABLET | Freq: Every day | ORAL | Status: DC
  Administered 2016-04-27: 1 via ORAL
  Filled 2016-04-26: qty 1

## 2016-04-26 MED ORDER — DIPHENHYDRAMINE HCL 50 MG/ML IJ SOLN: 12.5000 mg | INTRAMUSCULAR | Status: DC | PRN

## 2016-04-26 MED ORDER — FENTANYL CITRATE (PF) 100 MCG/2ML IJ SOLN: 50.0000 ug | INTRAMUSCULAR | Status: DC | PRN

## 2016-04-26 MED ORDER — SIMETHICONE 80 MG PO CHEW: 80.0000 mg | CHEWABLE_TABLET | ORAL | Status: DC | PRN

## 2016-04-26 MED ORDER — LACTATED RINGERS IV SOLN
INTRAVENOUS | Status: DC
  Administered 2016-04-26 (×2): via INTRAVENOUS

## 2016-04-26 MED ORDER — FENTANYL 2.5 MCG/ML BUPIVACAINE 1/10 % EPIDURAL INFUSION (WH - ANES)
INTRAMUSCULAR | Status: AC
  Filled 2016-04-26: qty 100

## 2016-04-26 MED ORDER — PHENYLEPHRINE 40 MCG/ML (10ML) SYRINGE FOR IV PUSH (FOR BLOOD PRESSURE SUPPORT)
80.0000 ug | PREFILLED_SYRINGE | INTRAVENOUS | Status: DC | PRN
  Filled 2016-04-26: qty 5

## 2016-04-26 MED ORDER — LACTATED RINGERS IV SOLN
500.0000 mL | Freq: Once | INTRAVENOUS | Status: AC
  Administered 2016-04-26: 500 mL via INTRAVENOUS

## 2016-04-26 MED ORDER — WITCH HAZEL-GLYCERIN EX PADS: 1.0000 "application " | MEDICATED_PAD | CUTANEOUS | Status: DC | PRN

## 2016-04-26 MED ORDER — ACETAMINOPHEN 325 MG PO TABS
650.0000 mg | ORAL_TABLET | ORAL | Status: DC | PRN
  Administered 2016-04-27 – 2016-04-28 (×2): 650 mg via ORAL
  Filled 2016-04-26 (×2): qty 2

## 2016-04-26 MED ORDER — ACETAMINOPHEN 325 MG PO TABS: 650.0000 mg | ORAL_TABLET | ORAL | Status: DC | PRN

## 2016-04-26 MED ORDER — EPHEDRINE 5 MG/ML INJ
10.0000 mg | INTRAVENOUS | Status: DC | PRN
  Filled 2016-04-26: qty 4

## 2016-04-26 MED ORDER — LIDOCAINE HCL (PF) 1 % IJ SOLN
INTRAMUSCULAR | Status: DC | PRN
Start: 2016-04-26 — End: 2016-04-26
  Administered 2016-04-26: 6 mL via EPIDURAL
  Administered 2016-04-26: 4 mL

## 2016-04-26 MED ORDER — OXYTOCIN BOLUS FROM INFUSION
500.0000 mL | Freq: Once | INTRAVENOUS | Status: AC
  Administered 2016-04-26: 500 mL via INTRAVENOUS

## 2016-04-26 MED ORDER — IBUPROFEN 600 MG PO TABS
600.0000 mg | ORAL_TABLET | Freq: Four times a day (QID) | ORAL | Status: DC
  Administered 2016-04-26 – 2016-04-27 (×5): 600 mg via ORAL
  Filled 2016-04-26 (×5): qty 1

## 2016-04-26 MED ORDER — OXYCODONE HCL 5 MG PO TABS: 10.0000 mg | ORAL_TABLET | ORAL | Status: DC | PRN

## 2016-04-26 MED ORDER — ONDANSETRON HCL 4 MG/2ML IJ SOLN
4.0000 mg | INTRAMUSCULAR | Status: DC | PRN
Start: 2016-04-26 — End: 2016-04-28

## 2016-04-26 MED ORDER — ONDANSETRON HCL 4 MG PO TABS: 4.0000 mg | ORAL_TABLET | ORAL | Status: DC | PRN

## 2016-04-26 MED ORDER — SENNOSIDES-DOCUSATE SODIUM 8.6-50 MG PO TABS
2.0000 | ORAL_TABLET | ORAL | Status: DC
  Administered 2016-04-26: 2 via ORAL
  Filled 2016-04-26 (×2): qty 2

## 2016-04-26 MED ORDER — COCONUT OIL OIL: 1.0000 "application " | TOPICAL_OIL | Status: DC | PRN

## 2016-04-26 MED ORDER — DIPHENHYDRAMINE HCL 25 MG PO CAPS: 25.0000 mg | ORAL_CAPSULE | Freq: Four times a day (QID) | ORAL | Status: DC | PRN

## 2016-04-26 MED ORDER — TERBUTALINE SULFATE 1 MG/ML IJ SOLN
0.2500 mg | Freq: Once | INTRAMUSCULAR | Status: DC | PRN
  Filled 2016-04-26: qty 1

## 2016-04-26 MED ORDER — LIDOCAINE HCL (PF) 1 % IJ SOLN
30.0000 mL | INTRAMUSCULAR | Status: AC | PRN
  Administered 2016-04-26: 30 mL via SUBCUTANEOUS
  Filled 2016-04-26: qty 30

## 2016-04-26 MED ORDER — FENTANYL 2.5 MCG/ML BUPIVACAINE 1/10 % EPIDURAL INFUSION (WH - ANES)
14.0000 mL/h | INTRAMUSCULAR | Status: DC | PRN
  Administered 2016-04-26 (×2): 14 mL/h via EPIDURAL
  Filled 2016-04-26: qty 100

## 2016-04-26 MED ORDER — MISOPROSTOL 25 MCG QUARTER TABLET
25.0000 ug | ORAL_TABLET | ORAL | Status: DC | PRN
  Filled 2016-04-26: qty 1

## 2016-04-26 MED ORDER — OXYTOCIN 40 UNITS IN LACTATED RINGERS INFUSION - SIMPLE MED: 2.5000 [IU]/h | INTRAVENOUS | Status: DC

## 2016-04-26 MED ORDER — PHENYLEPHRINE 40 MCG/ML (10ML) SYRINGE FOR IV PUSH (FOR BLOOD PRESSURE SUPPORT)
80.0000 ug | PREFILLED_SYRINGE | INTRAVENOUS | Status: DC | PRN
  Filled 2016-04-26: qty 5
  Filled 2016-04-26: qty 10

## 2016-04-26 MED ORDER — OXYTOCIN 40 UNITS IN LACTATED RINGERS INFUSION - SIMPLE MED
1.0000 m[IU]/min | INTRAVENOUS | Status: DC
  Administered 2016-04-26: 1 m[IU]/min via INTRAVENOUS
  Filled 2016-04-26: qty 1000

## 2016-04-26 MED ORDER — FLEET ENEMA 7-19 GM/118ML RE ENEM: 1.0000 | ENEMA | RECTAL | Status: DC | PRN

## 2016-04-26 MED ORDER — LACTATED RINGERS IV SOLN: 500.0000 mL | INTRAVENOUS | Status: DC | PRN

## 2016-04-26 MED ORDER — ONDANSETRON HCL 4 MG/2ML IJ SOLN: 4.0000 mg | Freq: Four times a day (QID) | INTRAMUSCULAR | Status: DC | PRN

## 2016-04-26 MED ORDER — DIBUCAINE 1 % RE OINT: 1.0000 "application " | TOPICAL_OINTMENT | RECTAL | Status: DC | PRN

## 2016-04-26 MED ORDER — OXYTOCIN 40 UNITS IN LACTATED RINGERS INFUSION - SIMPLE MED: 1.0000 m[IU]/min | INTRAVENOUS | Status: DC

## 2016-04-26 MED ORDER — TETANUS-DIPHTH-ACELL PERTUSSIS 5-2.5-18.5 LF-MCG/0.5 IM SUSP: 0.5000 mL | Freq: Once | INTRAMUSCULAR | Status: DC

## 2016-04-26 MED ORDER — ZOLPIDEM TARTRATE 5 MG PO TABS: 5.0000 mg | ORAL_TABLET | Freq: Every evening | ORAL | Status: DC | PRN

## 2016-04-26 MED ORDER — PHENYLEPHRINE 40 MCG/ML (10ML) SYRINGE FOR IV PUSH (FOR BLOOD PRESSURE SUPPORT)
PREFILLED_SYRINGE | INTRAVENOUS | Status: AC
  Filled 2016-04-26: qty 20

## 2016-04-26 MED ORDER — OXYCODONE HCL 5 MG PO TABS: 5.0000 mg | ORAL_TABLET | ORAL | Status: DC | PRN

## 2016-04-26 MED ORDER — BENZOCAINE-MENTHOL 20-0.5 % EX AERO
1.0000 "application " | INHALATION_SPRAY | CUTANEOUS | Status: DC | PRN
  Administered 2016-04-27: 1 via TOPICAL
  Filled 2016-04-26: qty 56

## 2016-04-26 MED ORDER — SOD CITRATE-CITRIC ACID 500-334 MG/5ML PO SOLN: 30.0000 mL | ORAL | Status: DC | PRN

## 2016-04-26 NOTE — Anesthesia Procedure Notes (Signed)

## 2016-04-26 NOTE — Lactation Note (Addendum)
This note was copied from a baby's chart. Lactation Consultation Note  Patient Name: Marissa Adams [id number] Date: 04/26/2016 Reason for consult: Initial assessment;Other (Comment) (Crohns Disease)   Initial assessment with mom of < 1 hour old infant in [place]. Mom reports she was not able to BF older son due to Medications for Crohns. Mom reports her GI doctor now has her on Remicade and told her it was compatible. [place] Slider reports Remicade is an L3 and probably compatible. Remicade is a large molecule medications with little transfer into maternal serum/breast milk although some studies indicated some of the medication leaked into milk when studies postnatally when milk compartment may leak some large molecule medications. . Mom reports she had her last dose in mid January and her GI doc wants her to get again about a week after delivery. Copy of [place] Sl[employer] information explained and given to parents.   Mom had infant latched to breast and infant feeding actively. Infant on and off a few times needing relatching. Enc mom to BF infant 8-12 x in 24 hours at first feeding cues. Enc mom to use pillow and head support with latch. Mom with sofe compressible breasts and areola and everted nipples. Infant still feeding when I left the room. Feeding log given with instructions for use.   BF Resources Handout and LC Brochure given, mom informed of IP/OP Services, BF Support Groups and LC Brochure. Mom enc to call out for assistance as needed. Mom has ordered a Medela PIS this week.      Maternal Data Formula Feeding for Exclusion: No Has patient been taught Hand Expression?: Yes Does the patient have breastfeeding experience prior to this delivery?: No (Did not BF older child due to Medications for Crohns Disease)  Feeding Feeding Type: Breast Fed Length of feed: 30 min  LATCH Score/Interventions Latch: Grasps breast easily, tongue down, lips flanged, rhythmical sucking.  Audible Swallowing:  Spontaneous and intermittent  Type of Nipple: Everted at rest and after stimulation  Comfort (Breast/Nipple): Soft / non-tender     Hold (Positioning): Assistance needed to correctly position infant at breast and maintain latch. Intervention(s): Breastfeeding basics reviewed;Support Pillows;Position options;Skin to skin  LATCH Score: 9  Lactation Tools Discussed/Used WIC Program: No   Consult Status Consult Status: Follow-up Date: 04/27/16 Follow-up type: In-patient    Silas Flood Claudis Giovanelli 04/26/2016, 4:10 PM

## 2016-04-26 NOTE — Anesthesia Pain Management Evaluation Note (Signed)
  CRNA Pain Management Visit Note  Patient: Marissa Adams, 31 y.o., female  "Hello I am a member of the anesthesia team at [facility]. We have an anesthesia team available at all times to provide care throughout the hospital, including epidural management and anesthesia for C-section. I don't know your plan for the delivery whether it a natural birth, water birth, IV sedation, nitrous supplementation, doula or epidural, but we want to meet your pain goals."   1.Was your pain managed to your expectations on prior hospitalizations?   Yes   2.What is your expectation for pain management during this hospitalization?     Epidural  3.How can we help you reach that goal?   Record the patient's initial score and the patient's pain goal.   Pain: 0  Pain Goal: 6 [facility] wants you to be able to say your pain was always managed very well.  Laban Emperor 04/26/2016

## 2016-04-26 NOTE — Anesthesia Postprocedure Evaluation (Signed)
Anesthesia Post Note  Patient: Marissa Adams  Procedure(s) Performed: * No procedures listed *  Patient location during evaluation: Mother Baby Anesthesia Type: Epidural Level of consciousness: awake and alert and oriented Pain management: satisfactory to patient Vital Signs Assessment: post-procedure vital signs reviewed and stable Respiratory status: spontaneous breathing and nonlabored ventilation Cardiovascular status: stable Postop Assessment: no headache, no backache, no signs of nausea or vomiting, adequate PO intake and patient able to bend at knees (patient up walking) Anesthetic complications: no        Last Vitals:  Vitals:   04/26/16 1700 04/26/16 1800  BP: 125/67 134/77  Pulse: 72 90  Resp: 18 18  Temp: 36.7 C 36.9 C    Last Pain:  Vitals:   04/26/16 1830  TempSrc:   PainSc: 0-No pain   Pain Goal:                 Boone Gear

## 2016-04-26 NOTE — Progress Notes (Signed)
  Subjective: Aware of UCs, but not uncomfortable.  Objective: BP 118/79   Pulse 92   Temp 97.4 F (36.3 C) (Oral)   Resp 18   LMP 07/26/2015  No intake/output data recorded. No intake/output data recorded.  FHT: Category 1 UC:   irregular, every 2-4 minutes SVE:   Dilation: 3.5 Effacement (%): 60, 70 Station: -2 Exam by:: Manfred Arch CNM  AROM--clear fluid Pitocin at 9 mu/min  Assessment:  Induction for Crohn's dx Early labor GBS negative  Plan: Continue current care. Epidural prn.  Nigel Bridgeman CNM 04/26/2016, 12:22 PM

## 2016-04-26 NOTE — Progress Notes (Signed)
  Subjective: Feeling some pressure--epidural placed approx 1 hour ago with benefit.  Objective: BP 122/71   Pulse 77   Temp 97.8 F (36.6 C) (Oral)   Resp 18   Ht 5\' 4"  (1.626 m)   Wt 79.4 kg (175 lb)   LMP 07/26/2015   SpO2 100%   BMI 30.04 kg/m  No intake/output data recorded. No intake/output data recorded.  FHT: Category 2--early decels, occasional variables, moderate variability UC:   regular, every 2 minutes SVE:  Right sided rim, 100%, vtx, 0/+1 with contraction  Pitocin at 9 mu/min  Assessment:  Induction for Crohn's dx Transitional labor GBS negative  Plan: Decrease pitocin to 5 mu/min Observe for onset of pressure/urge to push. Dr. Su Hilt updated.  Marissa Adams CNM 04/26/2016, 2:29 PM

## 2016-04-26 NOTE — Anesthesia Preprocedure Evaluation (Signed)
Anesthesia Evaluation  Patient identified by MRN, date of birth, ID band Patient awake    Reviewed: Allergy & Precautions, H&P , Patient's Chart, lab work & pertinent test results  Airway Mallampati: II TM Distance: >3 FB Neck ROM: full    Dental  (+) Teeth Intact   Pulmonary  breath sounds clear to auscultation        Cardiovascular Rhythm:regular Rate:Normal     Neuro/Psych    GI/Hepatic   Endo/Other  diabetes  Renal/GU      Musculoskeletal   Abdominal   Peds  Hematology   Anesthesia Other Findings       Reproductive/Obstetrics (+) Pregnancy                           Anesthesia Physical Anesthesia Plan  ASA: II  Anesthesia Plan: Epidural   Post-op Pain Management:    Induction:   Airway Management Planned:   Additional Equipment:   Intra-op Plan:   Post-operative Plan:   Informed Consent: I have reviewed the patients History and Physical, chart, labs and discussed the procedure including the risks, benefits and alternatives for the proposed anesthesia with the patient or authorized representative who has indicated his/her understanding and acceptance.   Dental Advisory Given  Plan Discussed with:   Anesthesia Plan Comments: (Labs checked- platelets confirmed with RN in room. Fetal heart tracing, per RN, reported to be stable enough for sitting procedure. Discussed epidural, and patient consents to the procedure:  included risk of possible headache,backache, failed block, allergic reaction, and nerve injury. This patient was asked if she had any questions or concerns before the procedure started.)        Anesthesia Quick Evaluation  

## 2016-04-26 NOTE — Progress Notes (Signed)
  Subjective: Comfortable, aware of UCs, but not painful.  Mother and husband at bedside.  Objective: BP 122/74   Pulse 71   Temp 98.4 F (36.9 C) (Oral)   Resp 18   LMP 07/26/2015  No intake/output data recorded. No intake/output data recorded.   Vitals:   04/26/16 0830 04/26/16 0900 04/26/16 0930 04/26/16 1000  BP: 121/69 116/79 125/74 122/74  Pulse: 85 90 84 71  Resp:      Temp:      TempSrc:        FHT: Category 1 UC:   regular, every 2-3 minutes SVE:   Dilation: 2 Effacement (%): 60 Station: -2 at 0720 Pitocin at 5 mu/min  Assessment:  Induction for Crohn's dx Latent labor GBS negative  Plan: Continue current plan. Anticipate recheck of cx around noon, with AROM.  Nigel Bridgeman CNM 04/26/2016, 10:06 AM

## 2016-04-27 LAB — CBC
HEMATOCRIT: 34.2 % — AB (ref 36.0–46.0)
HEMOGLOBIN: 11.8 g/dL — AB (ref 12.0–15.0)
MCH: 28.6 pg (ref 26.0–34.0)
MCHC: 34.5 g/dL (ref 30.0–36.0)
MCV: 82.8 fL (ref 78.0–100.0)
Platelets: 284 10*3/uL (ref 150–400)
RBC: 4.13 MIL/uL (ref 3.87–5.11)
RDW: 14.7 % (ref 11.5–15.5)
WBC: 9.8 10*3/uL (ref 4.0–10.5)

## 2016-04-27 NOTE — Lactation Note (Addendum)
This note was copied from a baby's chart. Lactation Consultation Note  Patient Name: Marissa Adams [name] Date: 04/27/2016 Reason for consult: Follow-up assessment   Follow up with mom of 24 hour old infant. Mom reports she feels infant is feeling well. Mom denies nipple pain/tenderness with latch or BF and denies breasts feeling fuller today.   She asked if I could watch a latch. Infant latched easily to right breast in the cross cradle hold. Infant with flanged lips, rhythmic suckles and intermittent swallows. Enc mom to massage/compress breast with feeding. Discussed with mom to use awakening techniques as needed to keep infant awake at breast. Mom without further questions/concerns. Enc mom to call out for feeding assistance as needed.    Maternal Data Formula Feeding for Exclusion: No Has patient been taught Hand Expression?: Yes Does the patient have breastfeeding experience prior to this delivery?: No  Feeding Feeding Type: Breast Fed Length of feed: 10 min (LC observed first 10 minutes of feeding, still feeding when I left the room)  LATCH Score/Interventions Latch: Grasps breast easily, tongue down, lips flanged, rhythmical sucking. Intervention(s): Adjust position;Assist with latch;Breast massage;Breast compression  Audible Swallowing: A few with stimulation Intervention(s): Alternate breast massage;Hand expression;Skin to skin  Type of Nipple: Everted at rest and after stimulation  Comfort (Breast/Nipple): Soft / non-tender     Hold (Positioning): No assistance needed to correctly position infant at breast. Intervention(s): Breastfeeding basics reviewed;Support Pillows;Position options;Skin to skin  LATCH Score: 9  Lactation Tools Discussed/Used     Consult Status Consult Status: Follow-up Date: 04/28/16 Follow-up type: In-patient    Marissa Adams 04/27/2016, 4:02 PM

## 2016-04-27 NOTE — Progress Notes (Signed)
Subjective: Postpartum Day 1: Vaginal delivery, 2 laceration Patient up ad lib, reports no syncope or dizziness. Feeding:  Breast Contraceptive plan:  undecided  Objective: Vital signs in last 24 hours: Temp:  [97.4 F (36.3 C)-98.5 F (36.9 C)] 97.5 F (36.4 C) (02/14 2327) Pulse Rate:  [66-103] 87 (02/14 2327) Resp:  [16-20] 18 (02/14 2327) BP: (97-134)/(54-94) 107/62 (02/14 2327) SpO2:  [100 %] 100 % (02/14 1800) Weight:  [79.4 kg (175 lb)] 79.4 kg (175 lb) (02/14 1230)  Physical Exam:  General: alert, cooperative and no distress Lochia: appropriate Uterine Fundus: firm Perineum: well approximated DVT Evaluation: No evidence of DVT seen on physical exam. Negative Homan's sign.   CBC Latest Ref Rng & Units 04/26/2016 10/02/2015 09/24/2015  WBC 4.0 - 10.5 K/uL 8.4 15.5(H) 9.9  Hemoglobin 12.0 - 15.0 g/dL 94.4 96.7 59.1  Hematocrit 36.0 - 46.0 % 35.2(L) 36.0 37.9  Platelets 150 - 400 K/uL 345 429(H) 301     Assessment/Plan: Status post vaginal delivery day 1. Stable Continue current care. Plan for discharge tomorrow and Breastfeeding    Henderson Newcomer ProtheroCNM 04/27/2016, 6:06 AM

## 2016-04-28 MED ORDER — IBUPROFEN 600 MG PO TABS
600.0000 mg | ORAL_TABLET | Freq: Four times a day (QID) | ORAL | 1 refills | Status: DC | PRN
Start: 2016-04-28 — End: 2018-10-24

## 2016-04-28 NOTE — Discharge Instructions (Signed)
Contraception Choices Contraception (birth control) is the use of any methods or devices to prevent pregnancy. Below are some methods to help avoid pregnancy. Hormonal methods  Contraceptive implant. This is a thin, plastic tube containing progesterone hormone. It does not contain estrogen hormone. Your health care provider inserts the tube in the inner part of the upper arm. The tube can remain in place for up to 3 years. After 3 years, the implant must be removed. The implant prevents the ovaries from releasing an egg (ovulation), thickens the cervical mucus to prevent sperm from entering the uterus, and thins the lining of the inside of the uterus.  Progesterone-only injections. These injections are given every 3 months by your health care provider to prevent pregnancy. This synthetic progesterone hormone stops the ovaries from releasing eggs. It also thickens cervical mucus and changes the uterine lining. This makes it harder for sperm to survive in the uterus.  Birth control pills. These pills contain estrogen and progesterone hormone. They work by preventing the ovaries from releasing eggs (ovulation). They also cause the cervical mucus to thicken, preventing the sperm from entering the uterus. Birth control pills are prescribed by a health care provider.Birth control pills can also be used to treat heavy periods.  Minipill. This type of birth control pill contains only the progesterone hormone. They are taken every day of each month and must be prescribed by your health care provider.  Birth control patch. The patch contains hormones similar to those in birth control pills. It must be changed once a week and is prescribed by a health care provider.  Vaginal ring. The ring contains hormones similar to those in birth control pills. It is left in the vagina for 3 weeks, removed for 1 week, and then a new one is put back in place. The patient must be comfortable inserting and removing the ring from  the vagina.A health care provider's prescription is necessary.  Emergency contraception. Emergency contraceptives prevent pregnancy after unprotected sexual intercourse. This pill can be taken right after sex or up to 5 days after unprotected sex. It is most effective the sooner you take the pills after having sexual intercourse. Most emergency contraceptive pills are available without a prescription. Check with your pharmacist. Do not use emergency contraception as your only form of birth control. Barrier methods  Female condom. This is a thin sheath (latex or rubber) that is worn over the penis during sexual intercourse. It can be used with spermicide to increase effectiveness.  Female condom. This is a soft, loose-fitting sheath that is put into the vagina before sexual intercourse.  Diaphragm. This is a soft, latex, dome-shaped barrier that must be fitted by a health care provider. It is inserted into the vagina, along with a spermicidal jelly. It is inserted before intercourse. The diaphragm should be left in the vagina for 6 to 8 hours after intercourse.  Cervical cap. This is a round, soft, latex or plastic cup that fits over the cervix and must be fitted by a health care provider. The cap can be left in place for up to 48 hours after intercourse.  Sponge. This is a soft, circular piece of polyurethane foam. The sponge has spermicide in it. It is inserted into the vagina after wetting it and before sexual intercourse.  Spermicides. These are chemicals that kill or block sperm from entering the cervix and uterus. They come in the form of creams, jellies, suppositories, foam, or tablets. They do not require a prescription. They  are inserted into the vagina with an applicator before having sexual intercourse. The process must be repeated every time you have sexual intercourse. Intrauterine contraception  Intrauterine device (IUD). This is a T-shaped device that is put in a woman's uterus during  a menstrual period to prevent pregnancy. There are 2 types:  Copper IUD. This type of IUD is wrapped in copper wire and is placed inside the uterus. Copper makes the uterus and fallopian tubes produce a fluid that kills sperm. It can stay in place for 10 years.  Hormone IUD. This type of IUD contains the hormone progestin (synthetic progesterone). The hormone thickens the cervical mucus and prevents sperm from entering the uterus, and it also thins the uterine lining to prevent implantation of a fertilized egg. The hormone can weaken or kill the sperm that get into the uterus. It can stay in place for 3-5 years, depending on which type of IUD is used. Permanent methods of contraception  Female tubal ligation. This is when the woman's fallopian tubes are surgically sealed, tied, or blocked to prevent the egg from traveling to the uterus.  Hysteroscopic sterilization. This involves placing a small coil or insert into each fallopian tube. Your doctor uses a technique called hysteroscopy to do the procedure. The device causes scar tissue to form. This results in permanent blockage of the fallopian tubes, so the sperm cannot fertilize the egg. It takes about 3 months after the procedure for the tubes to become blocked. You must use another form of birth control for these 3 months.  Female sterilization. This is when the female has the tubes that carry sperm tied off (vasectomy).This blocks sperm from entering the vagina during sexual intercourse. After the procedure, the man can still ejaculate fluid (semen). Natural planning methods  Natural family planning. This is not having sexual intercourse or using a barrier method (condom, diaphragm, cervical cap) on days the woman could become pregnant.  Calendar method. This is keeping track of the length of each menstrual cycle and identifying when you are fertile.  Ovulation method. This is avoiding sexual intercourse during ovulation.  Symptothermal method.  This is avoiding sexual intercourse during ovulation, using a thermometer and ovulation symptoms.  Post-ovulation method. This is timing sexual intercourse after you have ovulated. Regardless of which type or method of contraception you choose, it is important that you use condoms to protect against the transmission of sexually transmitted infections (STIs). Talk with your health care provider about which form of contraception is most appropriate for you. This information is not intended to replace advice given to you by your health care provider. Make sure you discuss any questions you have with your health care provider. Document Released: 02/27/2005 Document Revised: 08/05/2015 Document Reviewed: 08/22/2012 Elsevier Interactive Patient Education  2017 [employer] Home Care Instructions for Mom Introduction  ACTIVITY  Gradually return to your regular activities.  Let yourself rest. Nap while your baby sleeps.  Avoid lifting anything that is heavier than 10 lb (4.5 kg) until your health care provider says it is okay.  Avoid activities that take a lot of effort and energy (are strenuous) until approved by your health care provider. Walking at a slow-to-moderate pace is usually safe.  If you had a cesarean delivery:  Do not vacuum, climb stairs, or drive a car for 4-6 weeks.  Have someone help you at home until you feel like you can do your usual activities yourself.  Do exercises as told by your health care provider,  if this applies. VAGINAL BLEEDING You may continue to bleed for 4-6 weeks after delivery. Over time, the amount of blood usually decreases and the color of the blood usually gets lighter. However, the flow of bright red blood may increase if you have been too active. If you need to use more than one pad in an hour because your pad gets soaked, or if you pass a large clot:  Lie down.  Raise your feet.  Place a cold compress on your lower abdomen.  Rest.  Call your  health care provider. If you are breastfeeding, your period should return anytime between 8 weeks after delivery and the time that you stop breastfeeding. If you are not breastfeeding, your period should return 6-8 weeks after delivery. PERINEAL CARE The perineal area, or perineum, is the part of your body between your thighs. After delivery, this area needs special care. Follow these instructions as told by your health care provider.  Take warm tub baths for 15-20 minutes.  Use medicated pads and pain-relieving sprays and creams as told.  Do not use tampons or douches until vaginal bleeding has stopped.  Each time you go to the bathroom:  Use a peri bottle.  Change your pad.  Use towelettes in place of toilet paper until your stitches have healed.  Do Kegel exercises every day. Kegel exercises help to maintain the muscles that support the vagina, bladder, and bowels. You can do these exercises while you are standing, sitting, or lying down. To do Kegel exercises:  Tighten the muscles of your abdomen and the muscles that surround your birth canal.  Hold for a few seconds.  Relax.  Repeat until you have done this 5 times in a row.  To prevent hemorrhoids from developing or getting worse:  Drink enough fluid to keep your urine clear or pale yellow.  Avoid straining when having a bowel movement.  Take over-the-counter medicines and stool softeners as told by your health care provider. BREAST CARE  Wear a tight-fitting bra.  Avoid taking over-the-counter pain medicine for breast discomfort.  Apply ice to the breasts to help with discomfort as needed:  Put ice in a plastic bag.  Place a towel between your skin and the bag.  Leave the ice on for 20 minutes or as told by your health care provider. NUTRITION  Eat a well-balanced diet.  Do not try to lose weight quickly by cutting back on calories.  Take your prenatal vitamins until your postpartum checkup or until your  health care provider tells you to stop. POSTPARTUM DEPRESSION You may find yourself crying for no apparent reason and unable to cope with all of the changes that come with having a newborn. This mood is called postpartum depression. Postpartum depression happens because your hormone levels change after delivery. If you have postpartum depression, get support from your partner, friends, and family. If the depression does not go away on its own after several weeks, contact your health care provider. BREAST SELF-EXAM Do a breast self-exam each month, at the same time of the month. If you are breastfeeding, check your breasts just after a feeding, when your breasts are less full. If you are breastfeeding and your period has started, check your breasts on day 5, 6, or 7 of your period. Report any lumps, bumps, or discharge to your health care provider. Know that breasts are normally lumpy if you are breastfeeding. This is temporary, and it is not a health risk. INTIMACY AND SEXUALITY Avoid  sexual activity for at least 3-4 weeks after delivery or until the brownish-red vaginal flow is completely gone. If you want to avoid pregnancy, use some form of birth control. You can get pregnant after delivery, even if you have not had your period. SEEK MEDICAL CARE IF:  You feel unable to cope with the changes that a child brings to your life, and these feelings do not go away after several weeks.  You notice a lump, a bump, or discharge on your breast. SEEK IMMEDIATE MEDICAL CARE IF:  Blood soaks your pad in 1 hour or less.  You have:  Severe pain or cramping in your lower abdomen.  A bad-smelling vaginal discharge.  A fever that is not controlled by medicine.  A fever, and an area of your breast is red and sore.  Pain or redness in your calf.  Sudden, severe chest pain.  Shortness of breath.  Painful or bloody urination.  Problems with your vision.  You vomit for 12 hours or longer.  You  develop a severe headache.  You have serious thoughts about hurting yourself, your child, or anyone else. This information is not intended to replace advice given to you by your health care provider. Make sure you discuss any questions you have with your health care provider. Document Released: 02/25/2000 Document Revised: 08/05/2015 Document Reviewed: 08/31/2014  2017 Elsevier

## 2016-04-28 NOTE — Lactation Note (Signed)
This note was copied from a baby's chart. Lactation Consultation Note  Patient Name: Marissa Adams Date: 04/28/2016 Reason for consult: Follow-up assessment  Baby 44 hours old. Mom reports that BF is going well, but she has some residual nipple soreness d/t letting the baby nurse on the tip of the nipple a couple of times. Mom given comfort gels with review, and enc to maintain a deep latch with nursing. Discussed how to safely remove baby from nipple and enc re-latching if baby not on deeply enough. Offered to assist with latch, but mom declined. Enc mom to use EBM on nipples for healing. Mom had questions about pumping to give a bottle a day, and all questions answered. Mom aware of OP/BFSG and LC phone line assistance after D/C.   Maternal Data    Feeding    LATCH Score/Interventions                      Lactation Tools Discussed/Used     Consult Status Consult Status: Complete    [name] 04/28/2016, 11:29 AM

## 2016-04-28 NOTE — Discharge Summary (Signed)
OB Discharge Summary     Patient Name: Marissa Adams DOB: [birth year] MRN: [id number]  Date of admission: 04/26/2016 Delivering MD: Nigel Bridgeman   Date of discharge: 04/28/2016  Admitting diagnosis: [redacted]w[redacted]d, Induction  Intrauterine pregnancy: [redacted]w[redacted]d     Secondary diagnosis:  Principal Problem:   Vaginal delivery Active Problems:   Crohn disease (HCC)   Sickle cell trait (HCC)   Abscess of buttock, left   Crohn's disease of rectum with abscess [facility])  Additional problems: None     Discharge diagnosis: Term Pregnancy Delivered                                                                                                Post partum procedures:None  Augmentation: AROM and Pitocin  Complications: None  Hospital course:  Induction of Labor With Vaginal Delivery   31 y.o. yo [id number] at [redacted]w[redacted]d was admitted to the hospital 04/26/2016 for induction of labor.  Indication for induction: Crohn's disease.  Patient had an uncomplicated labor course as follows: Membrane Rupture Time/Date: 12:13 PM ,04/26/2016   Intrapartum Procedures: Episiotomy: None [1]                                         Lacerations:  2nd degree [3];Perineal [11]  Patient had delivery of a Viable infant.  Information for the patient's newborn:  Jakhiya, Brower [[id number]]  Delivery Method: Vaginal, Spontaneous Delivery (Filed from Delivery Summary)   04/26/2016  Details of delivery can be found in separate delivery note.  Patient had a routine postpartum course. Patient is discharged home 04/28/16.  Physical exam  Vitals:   04/26/16 2327 04/27/16 0800 04/27/16 1913 04/28/16 0500  BP: 107/62 91/75 119/66 110/70  Pulse: 87 98 93 88  Resp: 18 18 17 18   Temp: 97.5 F (36.4 C) 98 F (36.7 C) 98.1 F (36.7 C) 98.3 F (36.8 C)  TempSrc: Oral Oral Oral Oral  SpO2:      Weight:      Height:       General: alert Lochia: appropriate Uterine Fundus: firm Incision: Healing well with no significant  drainage DVT Evaluation: No evidence of DVT seen on physical exam. Negative Homan's sign. Labs: Lab Results  Component Value Date   WBC 9.8 04/27/2016   HGB 11.8 (L) 04/27/2016   HCT 34.2 (L) 04/27/2016   MCV 82.8 04/27/2016   PLT 284 04/27/2016   CMP Latest Ref Rng & Units 10/02/2015  Glucose 65 - 99 mg/dL 90  BUN 6 - 20 mg/dL 5(L)  Creatinine 1.30 - 1.00 mg/dL 8.65  Sodium 784 - 696 mmol/L 135  Potassium 3.5 - 5.1 mmol/L 3.9  Chloride 101 - 111 mmol/L 104  CO2 22 - 32 mmol/L 22  Calcium 8.9 - 10.3 mg/dL 2.9(B)  Total Protein 6.5 - 8.1 g/dL 7.8  Total Bilirubin 0.3 - 1.2 mg/dL 1.1  Alkaline Phos 38 - 126 U/L 85  AST 15 - 41 U/L 17  ALT 14 - 54 U/L  11(L)    Discharge instruction: per After Visit Summary and "Baby and Me Booklet".  After visit meds:  Allergies as of 04/28/2016      Reactions   Contrast Media [iodinated Diagnostic Agents] Hives      Medication List    STOP taking these medications   glycopyrrolate 1 MG tablet Commonly known as:  ROBINUL   metoCLOPramide 10 MG tablet Commonly known as:  REGLAN   ondansetron 4 MG disintegrating tablet Commonly known as:  ZOFRAN ODT   promethazine 12.5 MG tablet Commonly known as:  PHENERGAN     TAKE these medications   acetaminophen 500 MG tablet Commonly known as:  TYLENOL Take 1,000 mg by mouth every 6 (six) hours as needed for mild pain, moderate pain or headache.   ibuprofen 600 MG tablet Commonly known as:  ADVIL,MOTRIN Take 1 tablet (600 mg total) by mouth every 6 (six) hours as needed.   ranitidine 150 MG tablet Commonly known as:  ZANTAC Take 1 tablet (150 mg total) by mouth 2 (two) times daily.       Diet: routine diet  Activity: Advance as tolerated. Pelvic rest for 6 weeks.   Outpatient follow up:6 weeks Follow up Appt:No future appointments. Follow up Visit:No Follow-up on file.  Postpartum contraception: Patient undecided at d/c--information given for her review.  Newborn  Data: Live born female, "Anette Riedel" Birth Weight: 6 lb 8.4 oz (2960 g) APGAR: 8, 9  Baby Feeding: Breast Disposition:home with mother   04/28/2016 Nigel Bridgeman, CNM

## 2016-05-01 ENCOUNTER — Inpatient Hospital Stay (HOSPITAL_COMMUNITY)
Admission: AD | Admit: 2016-05-01 | Payer: BLUE CROSS/BLUE SHIELD | Source: Ambulatory Visit | Admitting: Obstetrics & Gynecology

## 2018-03-13 NOTE — L&D Delivery Note (Signed)
Operative Delivery Note  At 5:05 PM a viable female, named [name],  was delivered via Vaginal, Vacuum Neurosurgeon).  Presentation: vertex; Position: Right,, Occiput,, Anterior; Station: +2.  Verbal consent: obtained from patient.  Risks and benefits were briefly discussed in detail. Rapid progression from 6 to 10. Before pushing efforts, FHR with prolonged bradycardia at 65 bpm. Kiwi vacuum easily placed on fetal head. Maximum pressure at 500 mmHg. Baby brought to crowning with 1 contraction with 2 pop-offs. FHR returned to 120 bpm. Patient allowed to push for delivery.  APGAR: 8, 9; weight  .   Placenta status: complete with 3 Vx cord Cord:  1 loose body cord loop  Anesthesia:  epidural Instruments: Kiwi Episiotomy: None Lacerations:  2nd degree Suture Repair: 3.0 vicryl rapide Est. Blood Loss (mL): 150  Mom to postpartum.  Baby to Couplet care / Skin to Skin.  Katharine Look A Landon Bassford 12/03/2018, 5:27 PM

## 2018-04-16 ENCOUNTER — Other Ambulatory Visit: Payer: Self-pay

## 2018-04-16 ENCOUNTER — Emergency Department (HOSPITAL_COMMUNITY)
Admission: EM | Admit: 2018-04-16 | Discharge: 2018-04-17 | Disposition: A | Discharge status: Home / Self Care | Payer: BLUE CROSS/BLUE SHIELD | Attending: Emergency Medicine | Admitting: Emergency Medicine

## 2018-04-16 ENCOUNTER — Encounter (HOSPITAL_COMMUNITY): Payer: Self-pay | Admitting: Emergency Medicine

## 2018-04-16 DIAGNOSIS — O26891 Other specified pregnancy related conditions, first trimester: Secondary | ICD-10-CM | POA: Insufficient documentation

## 2018-04-16 DIAGNOSIS — J111 Influenza due to unidentified influenza virus with other respiratory manifestations: Secondary | ICD-10-CM | POA: Diagnosis not present

## 2018-04-16 DIAGNOSIS — R112 Nausea with vomiting, unspecified: Secondary | ICD-10-CM | POA: Diagnosis not present

## 2018-04-16 DIAGNOSIS — Z3A01 Less than 8 weeks gestation of pregnancy: Secondary | ICD-10-CM | POA: Diagnosis not present

## 2018-04-16 DIAGNOSIS — R05 Cough: Secondary | ICD-10-CM | POA: Diagnosis not present

## 2018-04-16 DIAGNOSIS — M7918 Myalgia, other site: Secondary | ICD-10-CM | POA: Insufficient documentation

## 2018-04-16 NOTE — ED Triage Notes (Signed)
C/o non-productive cough, nausea, vomiting, and generalized body aches x 2-3 days.  Reports + home pregnancy test.  LMP 12/24.

## 2018-04-17 ENCOUNTER — Encounter (HOSPITAL_COMMUNITY): Payer: Self-pay | Admitting: Emergency Medicine

## 2018-04-17 LAB — INFLUENZA PANEL BY PCR (TYPE A & B)
Influenza A By PCR: NEGATIVE
Influenza B By PCR: POSITIVE — AB

## 2018-04-17 LAB — URINALYSIS, ROUTINE W REFLEX MICROSCOPIC
Bilirubin Urine: NEGATIVE
Glucose, UA: NEGATIVE mg/dL
Hgb urine dipstick: NEGATIVE
KETONES UR: 20 mg/dL — AB
Leukocytes, UA: NEGATIVE
Nitrite: NEGATIVE
PH: 5 (ref 5.0–8.0)
Protein, ur: NEGATIVE mg/dL
Specific Gravity, Urine: 1.014 (ref 1.005–1.030)

## 2018-04-17 LAB — POC URINE PREG, ED: Preg Test, Ur: POSITIVE — AB

## 2018-04-17 MED ORDER — METOCLOPRAMIDE HCL 10 MG PO TABS: 10.0000 mg | ORAL_TABLET | Freq: Four times a day (QID) | ORAL | 0 refills | Status: DC | PRN

## 2018-04-17 MED ORDER — ACETAMINOPHEN 500 MG PO TABS
1000.0000 mg | ORAL_TABLET | Freq: Once | ORAL | Status: AC
  Administered 2018-04-17: 1000 mg via ORAL
  Filled 2018-04-17: qty 2

## 2018-04-17 MED ORDER — PROMETHAZINE HCL 12.5 MG RE SUPP
12.5000 mg | RECTAL | Status: AC
  Administered 2018-04-17: 12.5 mg via RECTAL
  Filled 2018-04-17: qty 1

## 2018-04-17 MED ORDER — OSELTAMIVIR PHOSPHATE 75 MG PO CAPS: 75.0000 mg | ORAL_CAPSULE | Freq: Two times a day (BID) | ORAL | 0 refills | Status: DC

## 2018-04-17 NOTE — ED Notes (Signed)
ED Provider at bedside. 

## 2018-04-17 NOTE — ED Provider Notes (Signed)
[facility] Provider Note   CSN: [id number] Arrival date & time: 04/16/18  1713     History   Chief Complaint Chief Complaint  Patient presents with  . Cough  . Generalized Body Aches    HPI Marissa Adams is a 33 y.o. female.  The history is provided by the patient.  Cough  Cough characteristics:  Non-productive Severity:  Mild Onset quality:  Gradual Duration:  3 days Timing:  Intermittent Progression:  Unchanged Chronicity:  New Context: sick contacts   Context comment:  Son has the flu Relieved by:  Nothing Worsened by:  Nothing Ineffective treatments:  None tried Associated symptoms: rhinorrhea and sinus congestion   Associated symptoms: no chest pain, no chills, no diaphoresis, no ear fullness, no ear pain, no eye discharge, no fever, no headaches, no myalgias, no rash, no shortness of breath, no sore throat, no weight loss and no wheezing   Risk factors: no chemical exposure   Patient also has nausea and vomiting. Patient also took pregnancy test over the weekend and it was positive.  LMP 12/24.  No pain no vaginal discharge nor bleeding.    Past Medical History:  Diagnosis Date  . Blood transfusion without reported diagnosis    Crohn's Disease  . Crohn's disease (HCC)   . 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 Vaginal Pap smear, abnormal   . Vitamin D deficiency     Patient Active Problem List   Diagnosis Date Noted  . Crohn's disease of rectum with abscess (HCC) 04/26/2016  . Vaginal delivery 04/26/2016  . Sickle cell trait (HCC) 04/25/2016  . Abscess of buttock, left 04/25/2016  . Perianal abscess 11/18/2015  . Crohn disease (HCC) 12/08/2011    Past Surgical History:  Procedure Laterality Date  . BUNIONECTOMY     L foot     OB History    Gravida  3   Para  2   Term  2   Preterm      AB  1   Living  2     SAB  1   TAB      Ectopic      Multiple  0     Live Births  2            Home Medications    Prior to Admission medications   Medication Sig Start Date End Date Taking? Authorizing Provider  acetaminophen (TYLENOL) 500 MG tablet Take 1,000 mg by mouth every 6 (six) hours as needed for mild pain, moderate pain or headache.    [provider]  ibuprofen (ADVIL,MOTRIN) 600 MG tablet Take 1 tablet (600 mg total) by mouth every 6 (six) hours as needed. 04/28/16   Nigel BridgemanLatham, Vicki, CNM  ranitidine (ZANTAC) 150 MG tablet Take 1 tablet (150 mg total) by mouth 2 (two) times daily. Patient not taking: Reported on 04/26/2016 10/24/15   Rasch, Harolyn RutherfordJennifer I, NP    Family History Family History  Problem Relation Age of Onset  . Diabetes Mother   . Stroke Paternal Uncle   . Hypertension Maternal Grandmother   . Diabetes Maternal Grandmother   . Hypertension Paternal Grandmother     Social History Social History   Tobacco Use  . Smoking status: Never Smoker  . Smokeless tobacco: Never Used  Substance Use Topics  . Alcohol use: No    Alcohol/week: 0.0 standard drinks  . Drug use: Yes  Types: Hydrocodone    Comment: beginning of pregnancy     Allergies   Contrast media [iodinated diagnostic agents]   Review of Systems Review of Systems  Constitutional: Negative for chills, diaphoresis, fever and weight loss.  HENT: Positive for rhinorrhea. Negative for ear pain and sore throat.   Eyes: Negative for discharge.  Respiratory: Positive for cough. Negative for shortness of breath and wheezing.   Cardiovascular: Negative for chest pain.  Gastrointestinal: Positive for nausea and vomiting. Negative for abdominal pain.  Musculoskeletal: Negative for myalgias.  Skin: Negative for rash.  Neurological: Negative for headaches.  All other systems reviewed and are negative.    Physical Exam Updated Vital Signs BP 108/64 (BP Location: Right Arm)   Pulse (!) 111   Temp 99.7 F (37.6 C) (Oral)   Resp 16   LMP 03/05/2018    SpO2 98%   Physical Exam Vitals signs and nursing note reviewed.  Constitutional:      Appearance: Normal appearance. She is normal weight.  HENT:     Head: Normocephalic and atraumatic.     Nose: Nose normal.     Mouth/Throat:     Mouth: Mucous membranes are moist.     Pharynx: Oropharynx is clear.  Eyes:     Conjunctiva/sclera: Conjunctivae normal.     Pupils: Pupils are equal, round, and reactive to light.  Neck:     Musculoskeletal: Normal range of motion and neck supple.  Cardiovascular:     Rate and Rhythm: Normal rate and regular rhythm.     Pulses: Normal pulses.     Heart sounds: Normal heart sounds.  Pulmonary:     Effort: Pulmonary effort is normal. No respiratory distress.     Breath sounds: Normal breath sounds. No stridor. No wheezing, rhonchi or rales.  Chest:     Chest wall: No tenderness.  Abdominal:     General: Abdomen is flat. Bowel sounds are normal.     Tenderness: There is no abdominal tenderness. There is no guarding.  Musculoskeletal: Normal range of motion.  Skin:    General: Skin is warm and dry.     Capillary Refill: Capillary refill takes less than 2 seconds.  Neurological:     General: No focal deficit present.     Mental Status: She is alert and oriented to person, place, and time.  Psychiatric:        Mood and Affect: Mood normal.        Behavior: Behavior normal.      ED Treatments / Results  Labs (all labs ordered are listed, but only abnormal results are displayed) Results for orders placed or performed during the hospital encounter of 04/16/18  Urinalysis, Routine w reflex microscopic  Result Value Ref Range   Color, Urine YELLOW YELLOW   APPearance HAZY (A) CLEAR   Specific Gravity, Urine 1.014 1.005 - 1.030   pH 5.0 5.0 - 8.0   Glucose, UA NEGATIVE NEGATIVE mg/dL   Hgb urine dipstick NEGATIVE NEGATIVE   Bilirubin Urine NEGATIVE NEGATIVE   Ketones, ur 20 (A) NEGATIVE mg/dL   Protein, ur NEGATIVE NEGATIVE mg/dL   Nitrite  NEGATIVE NEGATIVE   Leukocytes, UA NEGATIVE NEGATIVE  Influenza panel by PCR (type A & B)  Result Value Ref Range   Influenza A By PCR NEGATIVE NEGATIVE   Influenza B By PCR POSITIVE (A) NEGATIVE  POC Urine Pregnancy, ED (do NOT order at [facility])  Result Value Ref Range   Preg Test, Ur POSITIVE (  A) NEGATIVE   No results found.  EKG None  Radiology No results found.  Procedures Procedures (including critical care time)  Medications Ordered in ED Medications  acetaminophen (TYLENOL) tablet 1,000 mg (has no administration in time range)  promethazine (PHENERGAN) suppository 12.5 mg (has no administration in time range)    Well appearing, the patient does not have sepsis.  Follow up with your OB GYN for ongoing care.  You may take tylenol for fever.    Final Clinical Impressions(s) / ED Diagnoses   Return for pain, intractable cough, productive cough,fevers >100.4 unrelieved by medication, shortness of breath, intractable vomiting, or diarrhea, abdominal pain, passing out,Inability to tolerate liquids or food, cough, altered mental status or any concerns. No signs of systemic illness or infection. The patient is nontoxic-appearing on exam and vital signs are within normal limits.   I have reviewed the triage vital signs and the nursing notes. Pertinent labs &imaging results that were available during my care of the patient were reviewed by me and considered in my medical decision making (see chart for details).  After history, exam, and medical workup I feel the patient has been appropriately medically screened and is safe for discharge home. Pertinent diagnoses were discussed with the patient. Patient was given return precautions.   Caydee Talkington, MD 04/17/18 [phone]

## 2018-10-24 ENCOUNTER — Inpatient Hospital Stay (HOSPITAL_COMMUNITY)
Admission: EM | Admit: 2018-10-24 | Discharge: 2018-10-24 | Disposition: A | Discharge status: Home / Self Care | Payer: BC Managed Care – PPO | Attending: Obstetrics & Gynecology | Admitting: Obstetrics & Gynecology

## 2018-10-24 ENCOUNTER — Encounter (HOSPITAL_COMMUNITY): Payer: Self-pay | Admitting: *Deleted

## 2018-10-24 ENCOUNTER — Other Ambulatory Visit: Payer: Self-pay

## 2018-10-24 DIAGNOSIS — U071 COVID-19: Secondary | ICD-10-CM | POA: Diagnosis not present

## 2018-10-24 DIAGNOSIS — R432 Parageusia: Secondary | ICD-10-CM

## 2018-10-24 DIAGNOSIS — R05 Cough: Secondary | ICD-10-CM | POA: Insufficient documentation

## 2018-10-24 DIAGNOSIS — R430 Anosmia: Secondary | ICD-10-CM

## 2018-10-24 DIAGNOSIS — R109 Unspecified abdominal pain: Secondary | ICD-10-CM | POA: Insufficient documentation

## 2018-10-24 DIAGNOSIS — O26893 Other specified pregnancy related conditions, third trimester: Secondary | ICD-10-CM | POA: Diagnosis not present

## 2018-10-24 DIAGNOSIS — Z3A33 33 weeks gestation of pregnancy: Secondary | ICD-10-CM | POA: Insufficient documentation

## 2018-10-24 DIAGNOSIS — R52 Pain, unspecified: Secondary | ICD-10-CM

## 2018-10-24 DIAGNOSIS — Z3689 Encounter for other specified antenatal screening: Secondary | ICD-10-CM

## 2018-10-24 DIAGNOSIS — R112 Nausea with vomiting, unspecified: Secondary | ICD-10-CM

## 2018-10-24 DIAGNOSIS — O98513 Other viral diseases complicating pregnancy, third trimester: Secondary | ICD-10-CM

## 2018-10-24 DIAGNOSIS — O212 Late vomiting of pregnancy: Secondary | ICD-10-CM | POA: Diagnosis present

## 2018-10-24 DIAGNOSIS — R059 Cough, unspecified: Secondary | ICD-10-CM

## 2018-10-24 LAB — CBC WITH DIFFERENTIAL/PLATELET
Abs Immature Granulocytes: 0.03 10*3/uL (ref 0.00–0.07)
Basophils Absolute: 0 10*3/uL (ref 0.0–0.1)
Basophils Relative: 0 %
Eosinophils Absolute: 0 10*3/uL (ref 0.0–0.5)
Eosinophils Relative: 0 %
HCT: 43.2 % (ref 36.0–46.0)
Hemoglobin: 13.7 g/dL (ref 12.0–15.0)
Immature Granulocytes: 0 %
Lymphocytes Relative: 24 %
Lymphs Abs: 1.6 10*3/uL (ref 0.7–4.0)
MCH: 29.8 pg (ref 26.0–34.0)
MCHC: 31.7 g/dL (ref 30.0–36.0)
MCV: 93.9 fL (ref 80.0–100.0)
Monocytes Absolute: 0.6 10*3/uL (ref 0.1–1.0)
Monocytes Relative: 9 %
Neutro Abs: 4.5 10*3/uL (ref 1.7–7.7)
Neutrophils Relative %: 67 %
Platelets: 272 10*3/uL (ref 150–400)
RBC: 4.6 MIL/uL (ref 3.87–5.11)
RDW: 13.7 % (ref 11.5–15.5)
WBC: 6.7 10*3/uL (ref 4.0–10.5)
nRBC: 0 % (ref 0.0–0.2)

## 2018-10-24 LAB — URINALYSIS, ROUTINE W REFLEX MICROSCOPIC
Bilirubin Urine: NEGATIVE
Glucose, UA: NEGATIVE mg/dL
Hgb urine dipstick: NEGATIVE
Ketones, ur: 80 mg/dL — AB
Leukocytes,Ua: NEGATIVE
Nitrite: NEGATIVE
Protein, ur: 30 mg/dL — AB
Specific Gravity, Urine: 1.012 (ref 1.005–1.030)
pH: 5 (ref 5.0–8.0)

## 2018-10-24 MED ORDER — SODIUM CHLORIDE 0.9 % IV SOLN
8.0000 mg | Freq: Once | INTRAVENOUS | Status: AC
  Administered 2018-10-24: 8 mg via INTRAVENOUS
  Filled 2018-10-24: qty 4

## 2018-10-24 MED ORDER — BETAMETHASONE SOD PHOS & ACET 6 (3-3) MG/ML IJ SUSP: 12.0000 mg | INTRAMUSCULAR | Status: DC

## 2018-10-24 MED ORDER — FAMOTIDINE IN NACL 20-0.9 MG/50ML-% IV SOLN
20.0000 mg | Freq: Once | INTRAVENOUS | Status: AC
  Administered 2018-10-24: 20 mg via INTRAVENOUS
  Filled 2018-10-24: qty 50

## 2018-10-24 MED ORDER — ONDANSETRON 8 MG PO TBDP: 8.0000 mg | ORAL_TABLET | Freq: Three times a day (TID) | ORAL | 0 refills | Status: AC | PRN

## 2018-10-24 MED ORDER — LACTATED RINGERS IV BOLUS
1000.0000 mL | Freq: Once | INTRAVENOUS | Status: AC
  Administered 2018-10-24: 1000 mL via INTRAVENOUS

## 2018-10-24 MED ORDER — FAMOTIDINE 40 MG PO TABS: 40.0000 mg | ORAL_TABLET | Freq: Every evening | ORAL | 0 refills | Status: DC

## 2018-10-24 NOTE — MAU Provider Note (Signed)
History     CSN: [id number]  Arrival date and time: 10/24/18 1014  No chief complaint on file.  [name] is a 33 y.o. [phone] at 2432w2d who presents to MAU for N/V. Of note, pt was tested 10/17/2018 for COVID after her husband tested positive for coronavirus and was exhibiting symptoms. Pt reports she started experiencing symptoms 10/18/2018 with coughing, nausea, vomiting, body aches, loss of taste/smell. Pt denies chest pain and SOB. Pt also reports abdominal pain that is worse with vomiting and coughing, but "just feels sore" when this is not actively happening. Pt reports this is on her right side, next to her umbilicus. Pt reports LBP, which started today. Of note, pt actively vomiting with provider in the room.  Pt denies VB, LOF, ctx, decreased FM, vaginal discharge/odor/itching. Pt denies constipation, diarrhea, or urinary problems. Pt denies fever, chills, fatigue, sweating or changes in appetite. Pt denies SOB or chest pain. Pt denies dizziness, HA, light-headedness, weakness.  Problems this pregnancy include: Chron's disease. Allergies? Contrast dye Current medications/supplements? PNVs, Remicaide Prenatal care provider? [facility], next appt 10/31/2018   OB History    Gravida  4   Para  2   Term  2   Preterm      AB  1   Living  2     SAB  1   TAB      Ectopic      Multiple  0   Live Births  2           Past Medical History:  Diagnosis Date  . Blood transfusion without reported diagnosis    Crohn's Disease  . Crohn's disease (HCC)   . 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 Vaginal Pap smear, abnormal   . Vitamin D deficiency     Past Surgical History:  Procedure Laterality Date  . BUNIONECTOMY     L foot    Family History  Problem Relation Age of Onset  . Diabetes Mother   . Stroke Paternal Uncle   . Hypertension Maternal Grandmother   . Diabetes Maternal Grandmother   . Hypertension  Paternal Grandmother     Social History   Tobacco Use  . Smoking status: Never Smoker  . Smokeless tobacco: Never Used  Substance Use Topics  . Alcohol use: No    Alcohol/week: 0.0 standard drinks  . Drug use: Yes    Types: Hydrocodone    Comment: beginning of pregnancy    Allergies:  Allergies  Allergen Reactions  . Contrast Media [Iodinated Diagnostic Agents] Hives    Medications Prior to Admission  Medication Sig Dispense Refill Last Dose  . acetaminophen (TYLENOL) 500 MG tablet Take 1,000 mg by mouth every 6 (six) hours as needed for mild pain, moderate pain or headache.     . azaTHIOprine (IMURAN) 50 MG tablet Take 50 mg by mouth daily.     Marland Kitchen. ibuprofen (ADVIL,MOTRIN) 600 MG tablet Take 1 tablet (600 mg total) by mouth every 6 (six) hours as needed. (Patient taking differently: Take 600 mg by mouth every 6 (six) hours as needed for moderate pain. ) 30 tablet 1   . inFLIXimab (REMICADE) 100 MG injection Inject 100 mg into the vein every 6 (six) weeks.     . metoCLOPramide (REGLAN) 10 MG tablet Take 1 tablet (10 mg total) by mouth every 6 (six) hours as needed for nausea (nausea/headache). 6 tablet 0   . oseltamivir (TAMIFLU) 75  MG capsule Take 1 capsule (75 mg total) by mouth every 12 (twelve) hours. 10 capsule 0   . ranitidine (ZANTAC) 150 MG tablet Take 1 tablet (150 mg total) by mouth 2 (two) times daily. (Patient not taking: Reported on 04/26/2016) 60 tablet 1     Review of Systems  Constitutional: Negative for chills, diaphoresis, fatigue and fever.       Body aches.  Respiratory: Positive for cough. Negative for shortness of breath.   Cardiovascular: Negative for chest pain.  Gastrointestinal: Positive for abdominal pain, nausea and vomiting. Negative for constipation and diarrhea.  Genitourinary: Negative for dysuria, flank pain, frequency, pelvic pain, urgency, vaginal bleeding and vaginal discharge.  Musculoskeletal: Positive for back pain.  Neurological: Negative  for dizziness, weakness, light-headedness and headaches.       Loss of taste/smell   Physical Exam   Blood pressure 115/78, pulse (!) 116, temperature 98.7 F (37.1 C), resp. rate 18, last menstrual period 03/05/2018, SpO2 98 %, unknown if currently breastfeeding.  Patient Vitals for the past 24 hrs:  BP Temp Pulse Resp SpO2  10/24/18 1110 115/78 98.7 F (37.1 C) (!) 116 18 98 %   Physical Exam  Constitutional: She is oriented to person, place, and time. She appears well-developed and well-nourished. No distress.  HENT:  Head: Normocephalic and atraumatic.  Respiratory: Effort normal.  GI: Soft. She exhibits no distension and no mass. There is no abdominal tenderness. There is no rebound and no guarding.    Genitourinary: There is no rash, tenderness or lesion on the right labia. There is no rash, tenderness or lesion on the left labia.    Genitourinary Comments: CE: 1/long/posterior   Neurological: She is alert and oriented to person, place, and time.  Skin: Skin is warm and dry. She is not diaphoretic.  Psychiatric: She has a normal mood and affect. Her behavior is normal. Judgment and thought content normal.   Results for orders placed or performed during the hospital encounter of 10/24/18 (from the past 24 hour(s))  Urinalysis, Routine w reflex microscopic     Status: Abnormal   Collection Time: 10/24/18 11:19 AM  Result Value Ref Range   Color, Urine AMBER (A) YELLOW   APPearance HAZY (A) CLEAR   Specific Gravity, Urine 1.012 1.005 - 1.030   pH 5.0 5.0 - 8.0   Glucose, UA NEGATIVE NEGATIVE mg/dL   Hgb urine dipstick NEGATIVE NEGATIVE   Bilirubin Urine NEGATIVE NEGATIVE   Ketones, ur 80 (A) NEGATIVE mg/dL   Protein, ur 30 (A) NEGATIVE mg/dL   Nitrite NEGATIVE NEGATIVE   Leukocytes,Ua NEGATIVE NEGATIVE   WBC, UA 0-5 0 - 5 WBC/hpf   Bacteria, UA RARE (A) NONE SEEN   Squamous Epithelial / LPF 0-5 0 - 5   Mucus PRESENT    Granular Casts, UA PRESENT    Amorphous Crystal  PRESENT   CBC with Differential/Platelet     Status: None   Collection Time: 10/24/18 12:32 PM  Result Value Ref Range   WBC 6.7 4.0 - 10.5 K/uL   RBC 4.60 3.87 - 5.11 MIL/uL   Hemoglobin 13.7 12.0 - 15.0 g/dL   HCT 29.543.2 62.136.0 - 30.846.0 %   MCV 93.9 80.0 - 100.0 fL   MCH 29.8 26.0 - 34.0 pg   MCHC 31.7 30.0 - 36.0 g/dL   RDW 65.713.7 84.611.5 - 96.215.5 %   Platelets 272 150 - 400 K/uL   nRBC 0.0 0.0 - 0.2 %   Neutrophils Relative %  67 %   Neutro Abs 4.5 1.7 - 7.7 K/uL   Lymphocytes Relative 24 %   Lymphs Abs 1.6 0.7 - 4.0 K/uL   Monocytes Relative 9 %   Monocytes Absolute 0.6 0.1 - 1.0 K/uL   Eosinophils Relative 0 %   Eosinophils Absolute 0.0 0.0 - 0.5 K/uL   Basophils Relative 0 %   Basophils Absolute 0.0 0.0 - 0.1 K/uL   Immature Granulocytes 0 %   Abs Immature Granulocytes 0.03 0.00 - 0.07 K/uL   No results found.  MAU Course  Procedures  MDM -N/V, suspect related to +COVID diagnosis -UA: amber/hazy/80ketones/30PRO/rare bacteria, sending urine for culture -CBC w/ Diff: WNL, WBCs 6.7 -abdominal exam negative -CE: 1/long/posterior -1L LR + 8mg  Zofran +20mg  Pepcid given, pt reports N/V resolved -PO challenge successful -pt urinated after single bag of IV fluids -EFM: reactive       -baseline: 150       -variability: moderate       -accels: present, 15x15       -decels: present, few variables       -TOCO: irregular ctx (pt reports she is feeling mild tightening of stomach, but is not painful) -spoke with Dr. Harolyn Rutherford @1508 , reviewed tracing, per Dr. Harolyn Rutherford is OK. -pt discharged to home in stable condition  Orders Placed This Encounter  Procedures  . Culture, OB Urine    Standing Status:   Standing    Number of Occurrences:   1  . Urinalysis, Routine w reflex microscopic    Standing Status:   Standing    Number of Occurrences:   1  . CBC with Differential/Platelet    Standing Status:   Standing    Number of Occurrences:   1  . Droplet precaution    Standing Status:    Standing    Number of Occurrences:   1  . Insert peripheral IV    Standing Status:   Standing    Number of Occurrences:   1   Meds ordered this encounter  Medications  . lactated ringers bolus 1,000 mL  . ondansetron (ZOFRAN) 8 mg in sodium chloride 0.9 % 50 mL IVPB  . famotidine (PEPCID) IVPB 20 mg premix  . DISCONTD: betamethasone acetate-betamethasone sodium phosphate (CELESTONE) injection 12 mg  . ondansetron (ZOFRAN ODT) 8 MG disintegrating tablet    Sig: Take 1 tablet (8 mg total) by mouth every 8 (eight) hours as needed for up to 7 days for nausea or vomiting.    Dispense:  21 tablet    Refill:  0    Order Specific Question:   Supervising Provider    Answer:   Verita Schneiders A [[id number]]  . famotidine (PEPCID) 40 MG tablet    Sig: Take 1 tablet (40 mg total) by mouth every evening.    Dispense:  30 tablet    Refill:  0    Order Specific Question:   Supervising Provider    Answer:   Verita Schneiders A [[id number]]   Assessment and Plan   1. Non-intractable vomiting with nausea, unspecified vomiting type   2. [redacted] weeks gestation of pregnancy   3. NST (non-stress test) reactive   4. Real time reverse transcriptase PCR positive for COVID-19 virus   5. Cough   6. Body aches   7. Loss of taste   8. Loss of smell   9. Abdominal pain during pregnancy in third trimester    Allergies as of 10/24/2018      Reactions  Contrast Media [iodinated Diagnostic Agents] Hives      Medication List    STOP taking these medications   ibuprofen 600 MG tablet Commonly known as: ADVIL   ranitidine 150 MG tablet Commonly known as: Zantac     TAKE these medications   acetaminophen 500 MG tablet Commonly known as: TYLENOL Take 1,000 mg by mouth every 6 (six) hours as needed for mild pain, moderate pain or headache.   azaTHIOprine 50 MG tablet Commonly known as: IMURAN Take 50 mg by mouth daily.   famotidine 40 MG tablet Commonly known as: PEPCID Take 1 tablet (40 mg total) by mouth  every evening.   inFLIXimab 100 MG injection Commonly known as: REMICADE Inject 100 mg into the vein every 6 (six) weeks.   metoCLOPramide 10 MG tablet Commonly known as: Reglan Take 1 tablet (10 mg total) by mouth every 6 (six) hours as needed for nausea (nausea/headache).   ondansetron 8 MG disintegrating tablet Commonly known as: Zofran ODT Take 1 tablet (8 mg total) by mouth every 8 (eight) hours as needed for up to 7 days for nausea or vomiting.   oseltamivir 75 MG capsule Commonly known as: TAMIFLU Take 1 capsule (75 mg total) by mouth every 12 (twelve) hours.      -RX Pepcid -RX Zofran -encouraged fluids -list of safe medications in pregnancy given -pt advised to self-isolate per COVID guidelines given at discharge -new/worsening s/sx/PTL/VB/pain/return MAU precautions discussed -pt discharged to home in stable condition  Joni Reining E  10/24/2018, 4:00 PM

## 2018-10-24 NOTE — Discharge Instructions (Signed)
Preterm Labor and Birth Information ° °The normal length of a pregnancy is 39-41 weeks. Preterm labor is when labor starts before 37 completed weeks of pregnancy. °What are the risk factors for preterm labor? °Preterm labor is more likely to occur in women who: °· Have certain infections during pregnancy such as a bladder infection, sexually transmitted infection, or infection inside the uterus (chorioamnionitis). °· Have a shorter-than-normal cervix. °· Have gone into preterm labor before. °· Have had surgery on their cervix. °· Are younger than age 17 or older than age 35. °· Are African American. °· Are pregnant with twins or multiple babies (multiple gestation). °· Take street drugs or smoke while pregnant. °· Do not gain enough weight while pregnant. °· Became pregnant shortly after having been pregnant. °What are the symptoms of preterm labor? °Symptoms of preterm labor include: °· Cramps similar to those that can happen during a menstrual period. The cramps may happen with diarrhea. °· Pain in the abdomen or lower back. °· Regular uterine contractions that may feel like tightening of the abdomen. °· A feeling of increased pressure in the pelvis. °· Increased watery or bloody mucus discharge from the vagina. °· Water breaking (ruptured amniotic sac). °Why is it important to recognize signs of preterm labor? °It is important to recognize signs of preterm labor because babies who are born prematurely may not be fully developed. This can put them at an increased risk for: °· Long-term (chronic) heart and lung problems. °· Difficulty immediately after birth with regulating body systems, including blood sugar, body temperature, heart rate, and breathing rate. °· Bleeding in the brain. °· Cerebral palsy. °· Learning difficulties. °· Death. °These risks are highest for babies who are born before 34 weeks of pregnancy. °How is preterm labor treated? °Treatment depends on the length of your pregnancy, your condition,  and the health of your baby. It may involve: °· Having a stitch (suture) placed in your cervix to prevent your cervix from opening too early (cerclage). °· Taking or being given medicines, such as: °? Hormone medicines. These may be given early in pregnancy to help support the pregnancy. °? Medicine to stop contractions. °? Medicines to help mature the baby’s lungs. These may be prescribed if the risk of delivery is high. °? Medicines to prevent your baby from developing cerebral palsy. °If the labor happens before 34 weeks of pregnancy, you may need to stay in the hospital. °What should I do if I think I am in preterm labor? °If you think that you are going into preterm labor, call your health care provider right away. °How can I prevent preterm labor in future pregnancies? °To increase your chance of having a full-term pregnancy: °· Do not use any tobacco products, such as cigarettes, chewing tobacco, and e-cigarettes. If you need help quitting, ask your health care provider. °· Do not use street drugs or medicines that have not been prescribed to you during your pregnancy. °· Talk with your health care provider before taking any herbal supplements, even if you have been taking them regularly. °· Make sure you gain a healthy amount of weight during your pregnancy. °· Watch for infection. If you think that you might have an infection, get it checked right away. °· Make sure to tell your health care provider if you have gone into preterm labor before. °This information is not intended to replace advice given to you by your health care provider. Make sure you discuss any questions you have with your   health care provider. Document Released: 05/20/2003 Document Revised: 06/21/2018 Document Reviewed: 07/21/2015 Elsevier Patient Education  2020 [employer]                  Safe Medications in Pregnancy    Acne: Benzoyl Peroxide Salicylic Acid  Backache/Headache: Tylenol: 2 regular strength every 4 hours  OR              2 Extra strength every 6 hours  Colds/Coughs/Allergies: Benadryl (alcohol free) 25 mg every 6 hours as needed Breath right strips Claritin Cepacol throat lozenges Chloraseptic throat spray Cold-Eeze- up to three times per day Cough drops, alcohol free Flonase (by prescription only) Guaifenesin Mucinex Robitussin DM (plain only, alcohol free) Saline nasal spray/drops Sudafed (pseudoephedrine) & Actifed ** use only after [redacted] weeks gestation and if you do not have high blood pressure Tylenol Vicks Vaporub Zinc lozenges Zyrtec   Constipation: Colace Ducolax suppositories Fleet enema Glycerin suppositories Metamucil Milk of magnesia Miralax Senokot Smooth move tea  Diarrhea: Kaopectate Imodium A-D  *NO pepto Bismol  Hemorrhoids: Anusol Anusol HC Preparation H Tucks  Indigestion: Tums Maalox Mylanta Zantac  Pepcid  Insomnia: Benadryl (alcohol free) 25mg  every 6 hours as needed Tylenol PM Unisom, no Gelcaps  Leg Cramps: Tums MagGel  Nausea/Vomiting:  Bonine Dramamine Emetrol Ginger extract Sea bands Meclizine  Nausea medication to take during pregnancy:  Unisom (doxylamine succinate 25 mg tablets) Take one tablet daily at bedtime. If symptoms are not adequately controlled, the dose can be increased to a maximum recommended dose of two tablets daily (1/2 tablet in the morning, 1/2 tablet mid-afternoon and one at bedtime). Vitamin B6 100mg  tablets. Take one tablet twice a day (up to 200 mg per day).  Skin Rashes: Aveeno products Benadryl cream or 25mg  every 6 hours as needed Calamine Lotion 1% cortisone cream  Yeast infection: Gyne-lotrimin 7 Monistat 7   **If taking multiple medications, please check labels to avoid duplicating the same active ingredients **take medication as directed on the label ** Do not exceed 4000 mg of tylenol in 24 hours **Do not take medications that contain aspirin or ibuprofen      Abdominal  Pain During Pregnancy  Abdominal pain is common during pregnancy, and has many possible causes. Some causes are more serious than others, and sometimes the cause is not known. Abdominal pain can be a sign that labor is starting. It can also be caused by normal growth and stretching of muscles and ligaments during pregnancy. Always tell your health care provider if you have any abdominal pain. Follow these instructions at home:  Do not have sex or put anything in your vagina until your pain goes away completely.  Get plenty of rest until your pain improves.  Drink enough fluid to keep your urine pale yellow.  Take over-the-counter and prescription medicines only as told by your health care provider.  Keep all follow-up visits as told by your health care provider. This is important. Contact a health care provider if:  Your pain continues or gets worse after resting.  You have lower abdominal pain that: ? Comes and goes at regular intervals. ? Spreads to your back. ? Is similar to menstrual cramps.  You have pain or burning when you urinate. Get help right away if:  You have a fever or chills.  You have vaginal bleeding.  You are leaking fluid from your vagina.  You are passing tissue from your vagina.  You have vomiting or diarrhea that lasts for  more than 24 hours.  Your baby is moving less than usual.  You feel very weak or faint.  You have shortness of breath.  You develop severe pain in your upper abdomen. Summary  Abdominal pain is common during pregnancy, and has many possible causes.  If you experience abdominal pain during pregnancy, tell your health care provider right away.  Follow your health care provider's home care instructions and keep all follow-up visits as directed. This information is not intended to replace advice given to you by your health care provider. Make sure you discuss any questions you have with your health care provider. Document  Released: 02/27/2005 Document Revised: 06/17/2018 Document Reviewed: 06/01/2016 Elsevier Patient Education  2020 Elsevier Inc.    Person Under Monitoring Name: Marissa Adams  Location: [address] [place] [address]   Infection Prevention Recommendations for Individuals Confirmed to have, or Being Evaluated for, 2019 Novel Coronavirus (COVID-19) Infection Who Receive Care at Home  Individuals who are confirmed to have, or are being evaluated for, COVID-19 should follow the prevention steps below until a healthcare provider or local or state health department says they can return to normal activities.  Stay home except to get medical care You should restrict activities outside your home, except for getting medical care. Do not go to work, school, or public areas, and do not use public transportation or taxis.  Call ahead before visiting your doctor Before your medical appointment, call the healthcare provider and tell them that you have, or are being evaluated for, COVID-19 infection. This will help the healthcare providers office take steps to keep other people from getting infected. Ask your healthcare provider to call the local or state health department.  Monitor your symptoms Seek prompt medical attention if your illness is worsening (e.g., difficulty breathing). Before going to your medical appointment, call the healthcare provider and tell them that you have, or are being evaluated for, COVID-19 infection. Ask your healthcare provider to call the local or state health department.  Wear a facemask You should wear a facemask that covers your nose and mouth when you are in the same room with other people and when you visit a healthcare provider. People who live with or visit you should also wear a facemask while they are in the same room with you.  Separate yourself from other people in your home As much as possible, you should stay in a different room  from other people in your home. Also, you should use a separate bathroom, if available.  Avoid sharing household items You should not share dishes, drinking glasses, cups, eating utensils, towels, bedding, or other items with other people in your home. After using these items, you should wash them thoroughly with soap and water.  Cover your coughs and sneezes Cover your mouth and nose with a tissue when you cough or sneeze, or you can cough or sneeze into your sleeve. Throw used tissues in a lined trash can, and immediately wash your hands with soap and water for at least 20 seconds or use an alcohol-based hand rub.  Wash your [employer] your hands often and thoroughly with soap and water for at least 20 seconds. You can use an alcohol-based hand sanitizer if soap and water are not available and if your hands are not visibly dirty. Avoid touching your eyes, nose, and mouth with unwashed hands.   Prevention Steps for Caregivers and Household Members of Individuals Confirmed to have, or Being  Evaluated for, COVID-19 Infection Being Cared for in the Home  If you live with, or provide care at home for, a person confirmed to have, or being evaluated for, COVID-19 infection please follow these guidelines to prevent infection:  Follow healthcare providers instructions Make sure that you understand and can help the patient follow any healthcare provider instructions for all care.  Provide for the patients basic needs You should help the patient with basic needs in the home and provide support for getting groceries, prescriptions, and other personal needs.  Monitor the patients symptoms If they are getting sicker, call his or her medical provider and tell them that the patient has, or is being evaluated for, COVID-19 infection. This will help the healthcare providers office take steps to keep other people from getting infected. Ask the healthcare provider to call the local or state  health department.  Limit the number of people who have contact with the patient  If possible, have only one caregiver for the patient.  Other household members should stay in another home or place of residence. If this is not possible, they should stay  in another room, or be separated from the patient as much as possible. Use a separate bathroom, if available.  Restrict visitors who do not have an essential need to be in the home.  Keep older adults, very young children, and other sick people away from the patient Keep older adults, very young children, and those who have compromised immune systems or chronic health conditions away from the patient. This includes people with chronic heart, lung, or kidney conditions, diabetes, and cancer.  Ensure good ventilation Make sure that shared spaces in the home have good air flow, such as from an air conditioner or an opened window, weather permitting.  Wash your hands often  Wash your hands often and thoroughly with soap and water for at least 20 seconds. You can use an alcohol based hand sanitizer if soap and water are not available and if your hands are not visibly dirty.  Avoid touching your eyes, nose, and mouth with unwashed hands.  Use disposable paper towels to dry your hands. If not available, use dedicated cloth towels and replace them when they become wet.  Wear a facemask and gloves  Wear a disposable facemask at all times in the room and gloves when you touch or have contact with the patients blood, body fluids, and/or secretions or excretions, such as sweat, saliva, sputum, nasal mucus, vomit, urine, or feces.  Ensure the mask fits over your nose and mouth tightly, and do not touch it during use.  Throw out disposable facemasks and gloves after using them. Do not reuse.  Wash your hands immediately after removing your facemask and gloves.  If your personal clothing becomes contaminated, carefully remove clothing and  launder. Wash your hands after handling contaminated clothing.  Place all used disposable facemasks, gloves, and other waste in a lined container before disposing them with other household waste.  Remove gloves and wash your hands immediately after handling these items.  Do not share dishes, glasses, or other household items with the patient  Avoid sharing household items. You should not share dishes, drinking glasses, cups, eating utensils, towels, bedding, or other items with a patient who is confirmed to have, or being evaluated for, COVID-19 infection.  After the person uses these items, you should wash them thoroughly with soap and water.  Wash laundry thoroughly  Immediately remove and wash clothes or bedding that  have blood, body fluids, and/or secretions or excretions, such as sweat, saliva, sputum, nasal mucus, vomit, urine, or feces, on them.  Wear gloves when handling laundry from the patient.  Read and follow directions on labels of laundry or clothing items and detergent. In general, wash and dry with the warmest temperatures recommended on the label.  Clean all areas the individual has used often  Clean all touchable surfaces, such as counters, tabletops, doorknobs, bathroom fixtures, toilets, phones, keyboards, tablets, and bedside tables, every day. Also, clean any surfaces that may have blood, body fluids, and/or secretions or excretions on them.  Wear gloves when cleaning surfaces the patient has come in contact with.  Use a diluted bleach solution (e.g., dilute bleach with 1 part bleach and 10 parts water) or a household disinfectant with a label that says EPA-registered for coronaviruses. To make a bleach solution at home, add 1 tablespoon of bleach to 1 quart (4 cups) of water. For a larger supply, add  cup of bleach to 1 gallon (16 cups) of water.  Read labels of cleaning products and follow recommendations provided on product labels. Labels contain instructions for  safe and effective use of the cleaning product including precautions you should take when applying the product, such as wearing gloves or eye protection and making sure you have good ventilation during use of the product.  Remove gloves and wash hands immediately after cleaning.  Monitor yourself for signs and symptoms of illness Caregivers and household members are considered close contacts, should monitor their health, and will be asked to limit movement outside of the home to the extent possible. Follow the monitoring steps for close contacts listed on the symptom monitoring form.   ? If you have additional questions, contact your local health department or call the epidemiologist on call at [phone] (available 24/7). ? This guidance is subject to change. For the most up-to-date guidance from [facility], please refer to their website: TripMetro.huhttps://www.cdc.gov/coronavirus/2019-ncov/hcp/guidance-prevent-spread.html

## 2018-10-24 NOTE — MAU Note (Addendum)
Sent up from ER. 41month preg.  +covid (symptoms since Sat).  Here with c/o n/v- started 3 days ago. Pt taken directly to neg pressure rm

## 2018-10-26 LAB — CULTURE, OB URINE: Culture: <10000 — AB

## 2018-11-04 ENCOUNTER — Emergency Department (HOSPITAL_COMMUNITY): Payer: BC Managed Care – PPO

## 2018-11-04 ENCOUNTER — Encounter (HOSPITAL_COMMUNITY): Payer: Self-pay | Admitting: Emergency Medicine

## 2018-11-04 ENCOUNTER — Inpatient Hospital Stay (HOSPITAL_COMMUNITY)
Admission: EM | Admit: 2018-11-04 | Discharge: 2018-11-08 | DRG: 831 | Disposition: A | Discharge status: Home / Self Care | Payer: BC Managed Care – PPO | Attending: Internal Medicine | Admitting: Internal Medicine

## 2018-11-04 ENCOUNTER — Other Ambulatory Visit: Payer: Self-pay

## 2018-11-04 ENCOUNTER — Inpatient Hospital Stay (HOSPITAL_COMMUNITY)
Admission: AD | Admit: 2018-11-04 | Discharge: 2018-11-04 | Disposition: A | Discharge status: Home / Self Care | Payer: BC Managed Care – PPO | Source: Home / Self Care | Attending: Obstetrics and Gynecology | Admitting: Obstetrics and Gynecology

## 2018-11-04 DIAGNOSIS — O99513 Diseases of the respiratory system complicating pregnancy, third trimester: Secondary | ICD-10-CM | POA: Diagnosis present

## 2018-11-04 DIAGNOSIS — O24113 Pre-existing diabetes mellitus, type 2, in pregnancy, third trimester: Secondary | ICD-10-CM | POA: Diagnosis present

## 2018-11-04 DIAGNOSIS — E874 Mixed disorder of acid-base balance: Secondary | ICD-10-CM | POA: Diagnosis present

## 2018-11-04 DIAGNOSIS — R110 Nausea: Secondary | ICD-10-CM | POA: Diagnosis not present

## 2018-11-04 DIAGNOSIS — U071 COVID-19: Secondary | ICD-10-CM | POA: Diagnosis not present

## 2018-11-04 DIAGNOSIS — O98513 Other viral diseases complicating pregnancy, third trimester: Secondary | ICD-10-CM | POA: Diagnosis not present

## 2018-11-04 DIAGNOSIS — E860 Dehydration: Secondary | ICD-10-CM | POA: Diagnosis present

## 2018-11-04 DIAGNOSIS — Z79899 Other long term (current) drug therapy: Secondary | ICD-10-CM

## 2018-11-04 DIAGNOSIS — Z833 Family history of diabetes mellitus: Secondary | ICD-10-CM

## 2018-11-04 DIAGNOSIS — E8809 Other disorders of plasma-protein metabolism, not elsewhere classified: Secondary | ICD-10-CM | POA: Diagnosis present

## 2018-11-04 DIAGNOSIS — N179 Acute kidney failure, unspecified: Secondary | ICD-10-CM | POA: Diagnosis present

## 2018-11-04 DIAGNOSIS — R0602 Shortness of breath: Secondary | ICD-10-CM

## 2018-11-04 DIAGNOSIS — R17 Unspecified jaundice: Secondary | ICD-10-CM

## 2018-11-04 DIAGNOSIS — R7401 Elevation of levels of liver transaminase levels: Secondary | ICD-10-CM | POA: Diagnosis present

## 2018-11-04 DIAGNOSIS — O98913 Unspecified maternal infectious and parasitic disease complicating pregnancy, third trimester: Secondary | ICD-10-CM | POA: Diagnosis present

## 2018-11-04 DIAGNOSIS — O368130 Decreased fetal movements, third trimester, not applicable or unspecified: Secondary | ICD-10-CM | POA: Diagnosis present

## 2018-11-04 DIAGNOSIS — O26833 Pregnancy related renal disease, third trimester: Secondary | ICD-10-CM | POA: Diagnosis present

## 2018-11-04 DIAGNOSIS — O99013 Anemia complicating pregnancy, third trimester: Secondary | ICD-10-CM | POA: Diagnosis present

## 2018-11-04 DIAGNOSIS — K5090 Crohn's disease, unspecified, without complications: Secondary | ICD-10-CM | POA: Diagnosis present

## 2018-11-04 DIAGNOSIS — J1289 Other viral pneumonia: Secondary | ICD-10-CM | POA: Diagnosis present

## 2018-11-04 DIAGNOSIS — O99283 Endocrine, nutritional and metabolic diseases complicating pregnancy, third trimester: Secondary | ICD-10-CM | POA: Diagnosis present

## 2018-11-04 DIAGNOSIS — E878 Other disorders of electrolyte and fluid balance, not elsewhere classified: Secondary | ICD-10-CM | POA: Diagnosis present

## 2018-11-04 DIAGNOSIS — J1282 Pneumonia due to coronavirus disease 2019: Secondary | ICD-10-CM | POA: Diagnosis present

## 2018-11-04 DIAGNOSIS — O219 Vomiting of pregnancy, unspecified: Secondary | ICD-10-CM | POA: Diagnosis present

## 2018-11-04 DIAGNOSIS — D573 Sickle-cell trait: Secondary | ICD-10-CM | POA: Diagnosis present

## 2018-11-04 DIAGNOSIS — Z3A34 34 weeks gestation of pregnancy: Secondary | ICD-10-CM

## 2018-11-04 DIAGNOSIS — E119 Type 2 diabetes mellitus without complications: Secondary | ICD-10-CM | POA: Diagnosis present

## 2018-11-04 LAB — CBC WITH DIFFERENTIAL/PLATELET
Abs Immature Granulocytes: 0.03 10*3/uL (ref 0.00–0.07)
Basophils Absolute: 0 10*3/uL (ref 0.0–0.1)
Basophils Relative: 0 %
Eosinophils Absolute: 0.1 10*3/uL (ref 0.0–0.5)
Eosinophils Relative: 1 %
HCT: 39.7 % (ref 36.0–46.0)
Hemoglobin: 13.1 g/dL (ref 12.0–15.0)
Immature Granulocytes: 0 %
Lymphocytes Relative: 25 %
Lymphs Abs: 1.9 10*3/uL (ref 0.7–4.0)
MCH: 30.1 pg (ref 26.0–34.0)
MCHC: 33 g/dL (ref 30.0–36.0)
MCV: 91.3 fL (ref 80.0–100.0)
Monocytes Absolute: 1 10*3/uL (ref 0.1–1.0)
Monocytes Relative: 13 %
Neutro Abs: 4.6 10*3/uL (ref 1.7–7.7)
Neutrophils Relative %: 61 %
Platelets: 427 10*3/uL — ABNORMAL HIGH (ref 150–400)
RBC: 4.35 MIL/uL (ref 3.87–5.11)
RDW: 13.6 % (ref 11.5–15.5)
WBC: 7.6 10*3/uL (ref 4.0–10.5)
nRBC: 0 % (ref 0.0–0.2)

## 2018-11-04 LAB — COMPREHENSIVE METABOLIC PANEL
ALT: 132 U/L — ABNORMAL HIGH (ref 0–44)
AST: 158 U/L — ABNORMAL HIGH (ref 15–41)
Albumin: 2.3 g/dL — ABNORMAL LOW (ref 3.5–5.0)
Alkaline Phosphatase: 152 U/L — ABNORMAL HIGH (ref 38–126)
Anion gap: 14 (ref 5–15)
BUN: 24 mg/dL — ABNORMAL HIGH (ref 6–20)
CO2: 16 mmol/L — ABNORMAL LOW (ref 22–32)
Calcium: 9.1 mg/dL (ref 8.9–10.3)
Chloride: 105 mmol/L (ref 98–111)
Creatinine, Ser: 2.75 mg/dL — ABNORMAL HIGH (ref 0.44–1.00)
GFR calc Af Amer: 25 mL/min — ABNORMAL LOW (ref >60)
GFR calc non Af Amer: 22 mL/min — ABNORMAL LOW (ref >60)
Glucose, Bld: 91 mg/dL (ref 70–99)
Potassium: 3.7 mmol/L (ref 3.5–5.1)
Sodium: 135 mmol/L (ref 135–145)
Total Bilirubin: 6.2 mg/dL — ABNORMAL HIGH (ref 0.3–1.2)
Total Protein: 8.4 g/dL — ABNORMAL HIGH (ref 6.5–8.1)

## 2018-11-04 LAB — FERRITIN: Ferritin: 530 ng/mL — ABNORMAL HIGH (ref 11–307)

## 2018-11-04 LAB — LACTIC ACID, PLASMA: Lactic Acid, Venous: 1.8 mmol/L (ref 0.5–1.9)

## 2018-11-04 LAB — TYPE AND SCREEN
ABO/RH(D): B POS
Antibody Screen: NEGATIVE

## 2018-11-04 LAB — TRIGLYCERIDES: Triglycerides: 345 mg/dL — ABNORMAL HIGH (ref <150)

## 2018-11-04 LAB — C-REACTIVE PROTEIN: CRP: 3.4 mg/dL — ABNORMAL HIGH (ref <1.0)

## 2018-11-04 LAB — D-DIMER, QUANTITATIVE: D-Dimer, Quant: 5.28 ug/mL-FEU — ABNORMAL HIGH (ref 0.00–0.50)

## 2018-11-04 LAB — LACTATE DEHYDROGENASE: LDH: 334 U/L — ABNORMAL HIGH (ref 98–192)

## 2018-11-04 LAB — PROCALCITONIN: Procalcitonin: 0.75 ng/mL

## 2018-11-04 LAB — FIBRINOGEN: Fibrinogen: >800 mg/dL — ABNORMAL HIGH (ref 210–475)

## 2018-11-04 MED ORDER — NIFEDIPINE 10 MG PO CAPS
10.0000 mg | ORAL_CAPSULE | Freq: Once | ORAL | Status: AC
  Administered 2018-11-04: 18:00:00 10 mg via ORAL

## 2018-11-04 MED ORDER — LACTATED RINGERS IV SOLN: INTRAVENOUS | Status: AC

## 2018-11-04 MED ORDER — ADULT MULTIVITAMIN W/MINERALS CH
1.0000 | ORAL_TABLET | Freq: Every day | ORAL | Status: DC
  Administered 2018-11-05 – 2018-11-08 (×4): 1 via ORAL
  Filled 2018-11-04 (×4): qty 1

## 2018-11-04 MED ORDER — ONDANSETRON HCL 4 MG/2ML IJ SOLN
4.0000 mg | Freq: Four times a day (QID) | INTRAMUSCULAR | Status: DC | PRN
  Administered 2018-11-04: 17:00:00 4 mg via INTRAVENOUS
  Filled 2018-11-04 (×2): qty 2

## 2018-11-04 MED ORDER — VITAMIN C 500 MG PO TABS
500.0000 mg | ORAL_TABLET | Freq: Every day | ORAL | Status: DC
  Administered 2018-11-05 – 2018-11-08 (×4): 500 mg via ORAL
  Filled 2018-11-04 (×4): qty 1

## 2018-11-04 MED ORDER — ZINC SULFATE 220 (50 ZN) MG PO CAPS
220.0000 mg | ORAL_CAPSULE | Freq: Every day | ORAL | Status: DC
  Administered 2018-11-05 – 2018-11-08 (×4): 220 mg via ORAL
  Filled 2018-11-04 (×4): qty 1

## 2018-11-04 MED ORDER — SODIUM CHLORIDE 0.9 % IV SOLN
INTRAVENOUS | Status: DC
  Administered 2018-11-05: 02:00:00 via INTRAVENOUS

## 2018-11-04 MED ORDER — LACTATED RINGERS IV BOLUS
1000.0000 mL | Freq: Once | INTRAVENOUS | Status: AC
  Administered 2018-11-04: 16:00:00 1000 mL via INTRAVENOUS

## 2018-11-04 NOTE — ED Provider Notes (Addendum)
[facility] Provider Note   CSN: [id number] Arrival date & time: 11/04/18  1308     History   Chief Complaint Chief Complaint  Patient presents with  . [redacted] weeks pregnant  . Nausea    HPI Marissa Adams is a 33 y.o. female.     HPI 33 y.o. [id number] comes in with chief complaint of nausea, vomiting. Patient also has history of Crohn's disease, she is 35+ weeks pregnant and was diagnosed with COVID-19 first week of August.  Patient reports that she continues to have slight cough, exertional shortness of breath, dizziness and unsteadiness when she walks, nausea and vomiting.  Her appetite is also reduced.  She has about 5 episodes of emesis a day, mostly nonbilious.   She came into [facility]U today because her symptoms have been persistent 2 weeks after the diagnosis, and she is concerned about her wellbeing especially given that she is nearing her term.  Of note, patient is also complaining of having some abdominal contractions.  She reports that she has been having the symptoms for a while now -and they are not necessarily getting more intense.  Past Medical History:  Diagnosis Date  . Blood transfusion without reported diagnosis    Crohn's Disease  . Crohn's disease (HCC)   . 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 Vaginal Pap smear, abnormal   . Vitamin D deficiency     Patient Active Problem List   Diagnosis Date Noted  . Crohn's disease of rectum with abscess (HCC) 04/26/2016  . Vaginal delivery 04/26/2016  . Sickle cell trait (HCC) 04/25/2016  . Abscess of buttock, left 04/25/2016  . Perianal abscess 11/18/2015  . Crohn disease (HCC) 12/08/2011    Past Surgical History:  Procedure Laterality Date  . BUNIONECTOMY     L foot     OB History    Gravida  4   Para  2   Term  2   Preterm      AB  1   Living  2     SAB  1   TAB      Ectopic      Multiple  0   Live Births  2             Home Medications    Prior to Admission medications   Medication Sig Start Date End Date Taking? Authorizing Provider  acetaminophen (TYLENOL) 500 MG tablet Take 1,000 mg by mouth every 6 (six) hours as needed for mild pain, moderate pain or headache.   Yes [provider]  famotidine (PEPCID) 40 MG tablet Take 1 tablet (40 mg total) by mouth every evening. 10/24/18 10/24/19 Yes Nugent, Odie SeraNicole E, NP  inFLIXimab (REMICADE) 100 MG injection Inject 100 mg into the vein every 6 (six) weeks.   Yes [provider]  ondansetron (ZOFRAN-ODT) 4 MG disintegrating tablet Take 4 mg by mouth every 8 (eight) hours as needed for nausea or vomiting.   Yes [provider]  Polyethyl Glycol-Propyl Glycol (SYSTANE FREE OP) Place 1 drop into both eyes daily as needed (For dry eyes).    Yes [provider]  Prenatal Vit-Fe Fumarate-FA (PRENATAL MULTIVITAMIN) TABS tablet Take 1 tablet by mouth daily at 12 noon.   Yes [provider]    Family History Family History  Problem Relation Age of Onset  . Diabetes Mother   . Stroke Paternal Uncle   . Hypertension  Maternal Grandmother   . Diabetes Maternal Grandmother   . Hypertension Paternal Grandmother     Social History Social History   Tobacco Use  . Smoking status: Never Smoker  . Smokeless tobacco: Never Used  Substance Use Topics  . Alcohol use: No    Alcohol/week: 0.0 standard drinks  . Drug use: Yes    Types: Hydrocodone    Comment: beginning of pregnancy     Allergies   Contrast media [iodinated diagnostic agents]   Review of Systems Review of Systems  Constitutional: Positive for activity change and fatigue.  Respiratory: Positive for cough and shortness of breath.   Gastrointestinal: Positive for nausea and vomiting.  Neurological: Positive for dizziness.  All other systems reviewed and are negative.    Physical Exam Updated Vital Signs BP 115/72   Pulse 98   Temp 98.6  F (37 C) (Oral)   Resp (!) 21   LMP 03/05/2018   SpO2 100%   Physical Exam Vitals signs and nursing note reviewed.  Constitutional:      Appearance: She is well-developed.  HENT:     Head: Normocephalic and atraumatic.  Eyes:     Pupils: Pupils are equal, round, and reactive to light.  Neck:     Musculoskeletal: Neck supple.  Cardiovascular:     Rate and Rhythm: Regular rhythm. Tachycardia present.  Pulmonary:     Effort: Pulmonary effort is normal. No respiratory distress.  Abdominal:     General: There is no distension.     Palpations: Abdomen is soft.     Tenderness: There is no abdominal tenderness. There is no guarding or rebound.  Skin:    General: Skin is warm and dry.  Neurological:     Mental Status: She is alert and oriented to person, place, and time.      ED Treatments / Results  Labs (all labs ordered are listed, but only abnormal results are displayed) Labs Reviewed  CBC WITH DIFFERENTIAL/PLATELET - Abnormal; Notable for the following components:      Result Value   Platelets 427 (*)    All other components within normal limits  COMPREHENSIVE METABOLIC PANEL - Abnormal; Notable for the following components:   CO2 16 (*)    BUN 24 (*)    Creatinine, Ser 2.75 (*)    Total Protein 8.4 (*)    Albumin 2.3 (*)    AST 158 (*)    ALT 132 (*)    Alkaline Phosphatase 152 (*)    Total Bilirubin 6.2 (*)    GFR calc non Af Amer 22 (*)    GFR calc Af Amer 25 (*)    All other components within normal limits  D-DIMER, QUANTITATIVE (NOT AT [facility]) - Abnormal; Notable for the following components:   D-Dimer, Quant 5.28 (*)    All other components within normal limits  LACTATE DEHYDROGENASE - Abnormal; Notable for the following components:   LDH 334 (*)    All other components within normal limits  FERRITIN - Abnormal; Notable for the following components:   Ferritin 530 (*)    All other components within normal limits  TRIGLYCERIDES - Abnormal; Notable for the  following components:   Triglycerides 345 (*)    All other components within normal limits  FIBRINOGEN - Abnormal; Notable for the following components:   Fibrinogen >800 (*)    All other components within normal limits  C-REACTIVE PROTEIN - Abnormal; Notable for the following components:   CRP 3.4 (*)  All other components within normal limits  CULTURE, BLOOD (ROUTINE X 2)  CULTURE, BLOOD (ROUTINE X 2)  EXPECTORATED SPUTUM ASSESSMENT W REFEX TO RESP CULTURE  LACTIC ACID, PLASMA  PROCALCITONIN  URINALYSIS, ROUTINE W REFLEX MICROSCOPIC  LACTIC ACID, PLASMA  LIPASE, BLOOD  PROTEIN / CREATININE RATIO, URINE  HIV ANTIBODY (ROUTINE TESTING W REFLEX)  C-REACTIVE PROTEIN  D-DIMER, QUANTITATIVE (NOT AT [facility])  FERRITIN  FIBRINOGEN  INTERLEUKIN-6, PLASMA  LACTATE DEHYDROGENASE  PROCALCITONIN  SEDIMENTATION RATE  CBC WITH DIFFERENTIAL/PLATELET  HEPATITIS B SURFACE ANTIGEN  TRIGLYCERIDES  CBC WITH DIFFERENTIAL/PLATELET  COMPREHENSIVE METABOLIC PANEL  C-REACTIVE PROTEIN  CK  D-DIMER, QUANTITATIVE (NOT AT [facility])  FERRITIN  INTERLEUKIN-6, PLASMA  MAGNESIUM  PHOSPHORUS  TRIGLYCERIDES  I-STAT BETA HCG BLOOD, ED ([facility], [facility], [facility] ONLY)  TYPE AND SCREEN  TROPONIN I (HIGH SENSITIVITY)    EKG EKG Interpretation  Date/Time:  Monday November 04 2018 17:50:17 EDT Ventricular Rate:  96 PR Interval:    QRS Duration: 107 QT Interval:  385 QTC Calculation: 487 R Axis:   -16 Text Interpretation:  Sinus rhythm Borderline short PR interval Probable left ventricular hypertrophy Inferior infarct, old No acute changes No old tracing to compare Confirmed by Derwood Kaplan ([id number]) on 11/04/2018 7:43:36 PM   Radiology Dg Chest Port 1 View  Result Date: 11/04/2018 CLINICAL DATA:  COVID-19 pneumonia.  Dyspnea.  Cough. EXAM: PORTABLE CHEST 1 VIEW COMPARISON:  10/22/2008 chest radiograph. FINDINGS: Stable cardiomediastinal silhouette with normal heart size. No pneumothorax. No pleural effusion. Faint  patchy opacities in the peripheral lungs bilaterally. No pulmonary edema. IMPRESSION: Faint patchy opacities in the peripheral lungs bilaterally, compatible with atypical pneumonia. Electronically Signed   By: Delbert Phenix M.D.   On: 11/04/2018 16:53    Procedures .Critical Care Performed by: Derwood Kaplan, MD Authorized by: Derwood Kaplan, MD   Critical care provider statement:    Critical care time (minutes):  122   Critical care was necessary to treat or prevent imminent or life-threatening deterioration of the following conditions:  Renal failure and hepatic failure   Critical care was time spent personally by me on the following activities:  Discussions with consultants, evaluation of patient's response to treatment, examination of patient, ordering and performing treatments and interventions, ordering and review of laboratory studies, ordering and review of radiographic studies, pulse oximetry, re-evaluation of patient's condition, obtaining history from patient or surrogate and review of old charts   (including critical care time)  Medications Ordered in ED Medications  lactated ringers infusion ( Intravenous Restarted 11/04/18 1708)  ondansetron (ZOFRAN) injection 4 mg (4 mg Intravenous Given 11/04/18 1721)  vitamin C (ASCORBIC ACID) tablet 500 mg (has no administration in time range)  zinc sulfate capsule 220 mg (has no administration in time range)  multivitamin with minerals tablet 1 tablet (has no administration in time range)  lactated ringers bolus 1,000 mL (0 mLs Intravenous Stopped 11/04/18 2043)  NIFEdipine (PROCARDIA) capsule 10 mg (10 mg Oral Given 11/04/18 1758)     Initial Impression / Assessment and Plan / ED Course  I have reviewed the triage vital signs and the nursing notes.  Pertinent labs & imaging results that were available during my care of the patient were reviewed by me and considered in my medical decision making (see chart for details).  Clinical  Course as of Nov 03 2099  Mon Nov 04, 2018  1800 Patient appears comfortable.  Rapid OB still assessing.  Patient feels better from nausea perspective.  Fluid is  still running.   [AN]  1900 Rapid OB team had informed me that patient is no longer feeling contractions.  She at no point was in labor from their perspective and they are [facility].  They are recommending that [facility].  They will continue to be on consult team.  I have requested the rapid response OB nurse to [facility] and have them put the recommendations on Epic.   [AN]  2000 Medicine team is not comfortable admitting the patient given that she is advancing her pregnancy and at one point she was having contractions and feeling like her baby was not moving.  I informed them that [facility] the patient extensively in the ED and from their perspective and they have already cleared the patient from OB point of view.  They had also mentioned that patient needs admission for COVID-19 related complication and not primary OB issue, and thus they will be better served as being on consult.  Medicine team to discuss the case with director on call.   [AN]  2046 Medicine team has discussed the case with 1 of their leaders, and they still think that patient is best admitted by OB.  I spoke with Dr. Estanislado Pandyivard, OB.  She is no longer on for the service, and has given me contact for Dr. Richardson Doppole.  I spoke with Dr. [facility] Doppole, OB.  She still thinks patient should be admitted by medicine as the primary reason for admission is COVID-19 related complication and not obstetric.  I have discussed this conversation with the hospitalist and advised them to discuss the case with Dr. [facility] Doppole at the phone number she provided me.   [AN]    Clinical Course User Index [AN] Derwood KaplanNanavati, Jaleeya Mcnelly, MD      33 year old G4 P2 patient was about [redacted] weeks pregnant comes in with chief  complaint of persistent nausea with vomiting and shortness of breath with exertion.  She is also having some dizziness without any fainting spells.  Patient was diagnosed with COVID-19 earlier this month, and states that her symptoms have not gotten worse, but she continues to labor through her diagnosis and is concerned about her wellbeing especially since she is nearing her term.  CBC is within normal limits. CMP however showing AKI with creatinine greater than 2.5 and also bilirubin over 5.  Metabolic derangement is likely because of her COVID-19 and dehydration.  It also appears that she is having contractions that are premature.  That could be because of her dehydration.  Rapid OB at the bedside.  We will give her a bolus and reassess on destination for admission.    Final Clinical Impressions(s) / ED Diagnoses   Final diagnoses:  AKI (acute kidney injury) (HCC)  COVID-19 virus infection  Hyperbilirubinemia    ED Discharge Orders    None       Derwood KaplanNanavati, Mikenzi Raysor, MD 11/04/18 1700    Derwood KaplanNanavati, Denita Lun, MD 11/04/18 2102

## 2018-11-04 NOTE — Progress Notes (Signed)
Monitors removed, FHR 135 mod var, +accels, toco: ui. Spoke with Dr Landry Mellow on-call, she will relay to Dr Cletis Media for OB clearance note to be written. Orders received for daily NST while pt admitted.   Spoke with ED MD - pt to be admitted with hospitalist vs OB.  Will follow-up with RROB RN as needed.

## 2018-11-04 NOTE — Progress Notes (Signed)
Pt is a G4P2 at 53 6/[redacted] weeks gestation here with N&V, shortness of breath, cough since Aug 8th. Pt says she tested positive for covid on the 8th. Pt says her husband and children also have tested pos for covid, but are not symptomatic. V/S  Stable. 02 sats 99-100%. Says she gets her care at [facility]. Denies any problems with this pregnancy or previous pregnancies. Says her babies were delivered at term.

## 2018-11-04 NOTE — ED Notes (Signed)
This RN spoke with the charge RN and APP in MAU with details about the patients presentation. They state "We didn't lay eyes on her but her complaints was that her COVID-19 symptoms are returning so we sent her to you to have the patient monitored with [place]."

## 2018-11-04 NOTE — Progress Notes (Signed)
Spoke with Dr. Cletis Media. Pt is contracting, but not feeling them. Cervix is closed, anterior, presenting part high. No bloody show. Notified that pt has 441ml lr left to infuse. Chest xray shows pneumonia. V/S stable. 02 sats 99-100%. Notified of abnormal CMP. Orders received for protein/creatine ratio when pt can void. Orders received for procardia 10mg  po now for uterine contractions. Pt not feeling uc's.

## 2018-11-04 NOTE — Progress Notes (Signed)
Spoke with Dr. Cletis Media. Orders received for 1031ml bolus of LR. FHR baseline 145, moderate variability, no accels, no decels. Uterine irritability noted. No vaginal bleeding or leaking of fluid. Pt tested positiive for covid on Aug 8th. Presenting with cough, N&V, shortness of breath. V/S are stable. 02 sats are 99-100%. U/A ordered also.

## 2018-11-04 NOTE — Progress Notes (Signed)
OB SERVICE NOTE  33 yo G4P2 at 34+6 weeks who tested  COVID19 positive on 10/19/18. She called our office today c/o persistent/intractable nausea and vomiting with SOB and was instructed to come to the hospital for evaluation.  Called by OB Rapid Response Nurse at 3:47 pm reporting reactive and reassuring fetal tracings and normal VS including a RR 19/min and O2 sat on room air at 99-100%. Instructed RN to bolus the patient with LR 1000 cc and continue to monitor baby.  Received 2nd call from OB Rapid Response Nurse at 5:40 pm  updating me on patient's status: patient has only received 400 cc of LR due to difficult vein access. Patient now contracting every  3-9 minutes, not feeling contractions. Baby remains a Category 1 tracing. RN performed a cervical exam: 0/ anterior/ unknown presentation. Orders given to run the 1000 cc LR bolus and give Procardia 10 mg to stop uterine contractions likely due to dehydration. Patient has had 2 full term deliveries. No known risk factor for PTD. Informed of labs with creatinine at 2.75, AST 158, ALT 132, normal CBC with normal platelets, CXR positive for pneumonia. Patient still has not voided. Patient denies PIH symptoms. BP remains in the normal range.Orders given for urine analysis and urine PCR.  Received 3rd call from OB Rapid Response Nurse at 7:14 pm: contractions have subsided. VS remain normal. Fetal tracing is Category 1 (although I do not have access to tracings at this time). Patient has not voided yet although LR 1000 cc is completed. RN instructed to bolus a 2nd bag of LR 1000 cc.   Transferred OB care to Dr Landry Mellow for the night. Plan to consult with MFM in am. Recommend NST every shift as long as patient's vital signs remain normal and but continuous monitoring with any change.

## 2018-11-04 NOTE — ED Triage Notes (Signed)
Pt with cough, nausea and vomiting related to Covid-19 symptoms and is [redacted] weeks pregnant. She was dx positive on 8/8 and continues with symptoms. MAU sending here r/t covid like s/s. Pt reports decreased appetite along with decreased fetal movements. Charge and EDP notified.

## 2018-11-04 NOTE — ED Notes (Signed)
Rapid OB called- fetal monitor at bedside

## 2018-11-04 NOTE — H&P (Signed)
BARB SHEAR ZOX:[id number] DOB: August 22, 1985 DOA: 11/04/2018     PCP: Patient, No Pcp Per   Outpatient Specialists:    Gi Dr. Chilton Si at [facility]  Patient arrived to ER on 11/04/18 at 1308  Patient coming from: home Lives With family    Chief Complaint:   Chief Complaint  Patient presents with  . [redacted] weeks pregnant  . Nausea    HPI: PERIAN TEDDER is a 33 y.o. 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  Her husband initially was tested positive she developed initial coughing some nausea vomiting body aches loss of taste and smell no associated chest pain or shortness of breath initially She has been having intermittent nausea and vomiting since initially diagnosed has had some decreased p.o. intake and decreased appetite noted some decreased fetal movements and contractions.   Her husband and children also tested positive but not symptomatic. Her husband works at [employer] Other than that her pregnancy has been uneventful so far Denies hx of asthma, never smoked Had drug induced Dm years ago 2014     Infectious risk factors:  Reports  shortness of breath, dry cough,  N/V/Diarrhea  sick contacts, known COVID 19 exposure,   In  ER RAPID COVID TEST  POSITIVE,   Regarding pertinent Chronic problems:  Crohn's disease followed by GI at [facility] with Dr. Chilton Si    While in ER:  Rapid OB nurse evaluated patient in emergency department noted moderate variability with uterine irritability cervix is closed at this time.  Chest x-ray showing pneumonia patient is satting 99-100% room air Noted to have AKI with creatinine of 2.5 and elevated LFTs In emergency department patient was administered 1 L normal saline Patient was given  Procardia 10 mg for uterine contraction OB  requested medicine to admit they will consult  The following Work up has been ordered so far:  Orders Placed This Encounter  Procedures  . Critical Care  . Culture, blood (Routine X 2) w Reflex to ID Panel  . Culture, sputum-assessment  . DG Chest Port 1 View  . CBC with Differential  . Comprehensive metabolic panel  . Urinalysis, Routine w reflex microscopic  . Lactic acid, plasma  . D-dimer, quantitative  . Procalcitonin  . Lactate dehydrogenase  . Ferritin  . Triglycerides  . Fibrinogen  . C-reactive protein  . Lipase, blood  . Protein / creatinine ratio, urine  . HIV antibody (Routine Testing)  . C-reactive protein  . D-dimer, quantitative (not at [facility])  . Ferritin  . Fibrinogen  . Interleukin-6, Plasma  . Lactate dehydrogenase  . Procalcitonin  . Sedimentation rate  . CBC with Differential/Platelet  . Hepatitis B surface antigen  . Triglycerides  . CBC with Differential/Platelet  . Comprehensive metabolic panel  . C-reactive protein  . CK  . D-dimer, quantitative (not at [facility])  . Ferritin  . Interleukin-6, Plasma  . Magnesium  . Phosphorus  . Triglycerides  . Creatinine, urine, random  . Sodium, urine, random  . Diet regular Room service appropriate? Yes; Fluid consistency: Thin  . Cardiac monitoring  . Insert peripheral IV x 2  . Initiate Carrier Fluid Protocol  . Place surgical mask on patient  . Patient to wear surgical  mask during transportation  . Assess patient for ability to self-prone. If able (can move self in bed, ambulate) and stable (SpO2 and oxygen requirement):  . RN/NT - Document specific oxygen requirements in CHL  . Notify EDP if new oxygen requirements escalates > 4L per minute [place]  . RN to draw the following extra tubes:  . Fetal non-stress test  . Fetal non-stress test  . Nursing Communication If patient's vital signs change: BP >=140/90, O2 saturation <97%, RR>24/min, temp>=100.4 Change to  continuous fetal monitoring and tocometry and inform OB MD  . Novel Coronavirus PPE supplies (droplet and contact precautions) yellow stethoscopes, surgical mask, gowns, surgical caps, face shield, goggles, CAPR - on the floor/unit, cleaning [employer] (orange and purple top)  . Place COVID-19 isolation sign and PPE checklist outside the DOOR  . Do not give nonsteroidal anti-inflammatory drugs (NSAIDs)  . Patient to wear surgical mask during transportation  . Place working phone next to the patient  . Initiate Oral Care Protocol  . Initiate Carrier Fluid Protocol  . Cardiac Monitoring - Continuous Indefinite  . Cardiac monitoring  . Consult to hospitalist  ALL PATIENTS BEING ADMITTED/HAVING PROCEDURES NEED COVID-19 SCREENING  . Airborne and Contact precautions  . Pulse oximetry, continuous  . I-Stat beta hCG blood, ED  . ED EKG 12-Lead  . EKG 12-Lead  . EKG 12-Lead  . Type and screen  . ABO/Rh  . Insert saline lock  . Place in observation (patient's expected length of stay will be less than 2 midnights)     Following Medications were ordered in ER: Medications  lactated ringers infusion ( Intravenous Restarted 11/04/18 1708)  ondansetron (ZOFRAN) injection 4 mg (4 mg Intravenous Given 11/04/18 1721)  vitamin C (ASCORBIC ACID) tablet 500 mg (has no administration in time range)  zinc sulfate capsule 220 mg (has no administration in time range)  multivitamin with minerals tablet 1 tablet (has no administration in time range)  0.9 %  sodium chloride infusion (has no administration in time range)  lactated ringers bolus 1,000 mL (0 mLs Intravenous Stopped 11/04/18 2043)  NIFEdipine (PROCARDIA) capsule 10 mg (10 mg Oral Given 11/04/18 1758)        Consult Orders  (From admission, onward)         Start     Ordered   11/04/18 1926  Consult to hospitalist  ALL PATIENTS BEING ADMITTED/HAVING PROCEDURES NEED COVID-19 SCREENING  Once    Comments: ALL PATIENTS BEING ADMITTED/HAVING  PROCEDURES NEED COVID-19 SCREENING  Provider:  (Not yet assigned)  Question Answer Comment  Place call to: [facility]   Reason for Consult Admit      11/04/18 1925          ER Provider Called: OB/GYB    Dr. Richardson Doppole  They Recommend admit to medicine  Will continue to follow   Significant initial  Findings: Abnormal Labs Reviewed  CBC WITH DIFFERENTIAL/PLATELET - Abnormal; Notable for the following components:      Result Value   Platelets 427 (*)    All other components within normal limits  COMPREHENSIVE METABOLIC PANEL - Abnormal; Notable for the following components:   CO2 16 (*)    BUN 24 (*)    Creatinine, Ser 2.75 (*)    Total Protein 8.4 (*)    Albumin 2.3 (*)    AST 158 (*)    ALT 132 (*)    Alkaline Phosphatase 152 (*)    Total Bilirubin 6.2 (*)  GFR calc non Af Amer 22 (*)    GFR calc Af Amer 25 (*)    All other components within normal limits  D-DIMER, QUANTITATIVE (NOT AT [facility]) - Abnormal; Notable for the following components:   D-Dimer, Quant 5.28 (*)    All other components within normal limits  LACTATE DEHYDROGENASE - Abnormal; Notable for the following components:   LDH 334 (*)    All other components within normal limits  FERRITIN - Abnormal; Notable for the following components:   Ferritin 530 (*)    All other components within normal limits  TRIGLYCERIDES - Abnormal; Notable for the following components:   Triglycerides 345 (*)    All other components within normal limits  FIBRINOGEN - Abnormal; Notable for the following components:   Fibrinogen >800 (*)    All other components within normal limits  C-REACTIVE PROTEIN - Abnormal; Notable for the following components:   CRP 3.4 (*)    All other components within normal limits    Otherwise labs showing:    Recent Labs  Lab 11/04/18 1436  NA 135  K 3.7  CO2 16*  GLUCOSE 91  BUN 24*  CREATININE 2.75*  CALCIUM 9.1    Cr     Up from baseline see below Lab Results  Component  Value Date   CREATININE 2.75 (H) 11/04/2018   CREATININE 0.84 10/02/2015   CREATININE 0.87 09/24/2015    Recent Labs  Lab 11/04/18 1436  AST 158*  ALT 132*  ALKPHOS 152*  BILITOT 6.2*  PROT 8.4*  ALBUMIN 2.3*   Lab Results  Component Value Date   CALCIUM 9.1 11/04/2018      WBC      Component Value Date/Time   WBC 7.6 11/04/2018 1436   ANC    Component Value Date/Time   NEUTROABS 4.6 11/04/2018 1436   Plt: Lab Results  Component Value Date   PLT 427 (H) 11/04/2018    Lactic Acid, Venous    Component Value Date/Time   LATICACIDVEN 1.8 11/04/2018 1748    Procalcitonin 0.7   COVID-19 Labs  Recent Labs    11/04/18 1748  DDIMER 5.28*  FERRITIN 530*  LDH 334*  CRP 3.4*    No results found for: SARSCOV2NAA   HG/HCT  stable,       Component Value Date/Time   HGB 13.1 11/04/2018 1436   HCT 39.7 11/04/2018 1436       UA   ordered     CXR - atypical pNA   ECG:  Personally reviewed by me showing: HR : 96 Rhythm:  NSR,    no evidence of ischemic changes QTC 447       ED Triage Vitals  Enc Vitals Group     BP 11/04/18 1339 (!) 143/83     Pulse Rate 11/04/18 1339 (!) 114     Resp 11/04/18 1339 19     Temp 11/04/18 1339 98.6 F (37 C)     Temp Source 11/04/18 1339 Oral     SpO2 11/04/18 1339 97 %     Weight --      Height --      Head Circumference --      Peak Flow --      Pain Score 11/04/18 1414 4     Pain Loc --      Pain Edu? --      Excl. in [facility]? --   TMAX(24)@       Latest  Blood  pressure 115/72, pulse 98, temperature 98.6 F (37 C), temperature source Oral, resp. rate (!) 21, last menstrual period 03/05/2018, SpO2 100 %, unknown if currently breastfeeding.    Hospitalist was called for admission for Covid PNA   Review of Systems:    Pertinent positives include: shortness of breath at rest  non-productive cough, nausea, vomiting Constitutional:  No weight loss, night sweats, Fevers, chills, fatigue, weight loss  HEENT:   No headaches, Difficulty swallowing,Tooth/dental problems,Sore throat,  No sneezing, itching, ear ache, nasal congestion, post nasal drip,  Cardio-vascular:  No chest pain, Orthopnea, PND, anasarca, dizziness, palpitations.no Bilateral lower extremity swelling  GI:  No heartburn, indigestion, abdominal pain, , diarrhea, change in bowel habits, loss of appetite, melena, blood in stool, hematemesis Resp:  no . No dyspnea on exertion, No excess mucus, no productive cough, No No coughing up of blood.No change in color of mucus.No wheezing. Skin:  no rash or lesions. No jaundice GU:  no dysuria, change in color of urine, no urgency or frequency. No straining to urinate.  No flank pain.  Musculoskeletal:  No joint pain or no joint swelling. No decreased range of motion. No back pain.  Psych:  No change in mood or affect. No depression or anxiety. No memory loss.  Neuro: no localizing neurological complaints, no tingling, no weakness, no double vision, no gait abnormality, no slurred speech, no confusion  All systems reviewed and apart from HOPI all are negative  Past Medical History:   Past Medical History:  Diagnosis Date  . Blood transfusion without reported diagnosis    Crohn's Disease  . Crohn's disease (HCC)   . 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 Vaginal Pap smear, abnormal   . Vitamin D deficiency       Past Surgical History:  Procedure Laterality Date  . BUNIONECTOMY     L foot    Social History:  Ambulatory  independently       reports that she has never smoked. She has never used smokeless tobacco. She reports current drug use. Drug: Hydrocodone. She reports that she does not drink alcohol.     Family History:   Family History  Problem Relation Age of Onset  . Diabetes Mother   . Stroke Paternal Uncle   . Hypertension Maternal Grandmother   . Diabetes Maternal Grandmother   . Hypertension Paternal Grandmother      Allergies: Allergies  Allergen Reactions  . Contrast Media [Iodinated Diagnostic Agents] Hives     Prior to Admission medications   Medication Sig Start Date End Date Taking? Authorizing Provider  acetaminophen (TYLENOL) 500 MG tablet Take 1,000 mg by mouth every 6 (six) hours as needed for mild pain, moderate pain or headache.   Yes [provider]  famotidine (PEPCID) 40 MG tablet Take 1 tablet (40 mg total) by mouth every evening. 10/24/18 10/24/19 Yes Nugent, Odie Sera, NP  inFLIXimab (REMICADE) 100 MG injection Inject 100 mg into the vein every 6 (six) weeks.   Yes [provider]  ondansetron (ZOFRAN-ODT) 4 MG disintegrating tablet Take 4 mg by mouth every 8 (eight) hours as needed for nausea or vomiting.   Yes [provider]  Polyethyl Glycol-Propyl Glycol (SYSTANE FREE OP) Place 1 drop into both eyes daily as needed (For dry eyes).    Yes [provider]  Prenatal Vit-Fe Fumarate-FA (PRENATAL MULTIVITAMIN) TABS tablet Take 1 tablet by mouth daily at 12 noon.   Yes [provider]   Physical Exam: Blood pressure 115/72, pulse 98, temperature 98.6 F (37 C), temperature source Oral, resp. rate (!) 21, last menstrual period 03/05/2018, SpO2 100 %, unknown if currently breastfeeding. 1. General:  in No Acute distress   well -appearing 2. Psychological: Alert and   Oriented 3. Head/ENT:    Dry Mucous Membranes                          Head Non traumatic, neck supple                          Normal   Dentition 4. SKIN:  decreased Skin turgor,  Skin clean Dry and intact no rash 5. Heart: Regular rate and rhythm no  Murmur, no Rub or gallop 6. Lungs:  no wheezes or crackles   7. Abdomen: Soft,  non-tender, Gravid, bowel sounds present 8. Lower extremities: no clubbing, cyanosis, no  edema 9. Neurologically Grossly intact, moving all 4 extremities equally  10. MSK: Normal range of motion   All other LABS:     Recent Labs  Lab 11/04/18  1436  WBC 7.6  NEUTROABS 4.6  HGB 13.1  HCT 39.7  MCV 91.3  PLT 427*     Recent Labs  Lab 11/04/18 1436  NA 135  K 3.7  CL 105  CO2 16*  GLUCOSE 91  BUN 24*  CREATININE 2.75*  CALCIUM 9.1     Recent Labs  Lab 11/04/18 1436  AST 158*  ALT 132*  ALKPHOS 152*  BILITOT 6.2*  PROT 8.4*  ALBUMIN 2.3*       Cultures:    Component Value Date/Time   SDES URINE, RANDOM 10/24/2018 1111   SPECREQUEST NONE 10/24/2018 1111   CULT (A) 10/24/2018 1111    <10,000 COLONIES/mL INSIGNIFICANT GROWTH NO GROUP B STREP (S.AGALACTIAE) ISOLATED Performed at [facility] [address], [place], [place] [address]    REPTSTATUS 10/26/2018 FINAL 10/24/2018 1111     Radiological Exams on Admission: Dg Chest Port 1 View  Result Date: 11/04/2018 CLINICAL DATA:  COVID-19 pneumonia.  Dyspnea.  Cough. EXAM: PORTABLE CHEST 1 VIEW COMPARISON:  10/22/2008 chest radiograph. FINDINGS: Stable cardiomediastinal silhouette with normal heart size. No pneumothorax. No pleural effusion. Faint patchy opacities in the peripheral lungs bilaterally. No pulmonary edema. IMPRESSION: Faint patchy opacities in the peripheral lungs bilaterally, compatible with atypical pneumonia. Electronically Signed   By: Ilona Sorrel M.D.   On: 11/04/2018 16:53    Chart has been reviewed    Assessment/Plan   33 y.o. female with medical history significant of  Cohn's disease on Ram[employer] every 4 wks, curetnly 34 6/[redacted] weeks pregnant  Admitted for AKI and Covid PNA  Present on Admission: . Pneumonia due to COVID-19 virus -  FROM HOME WITH KNOWN HX OF COVID19    Following concerning LAB/ imaging findings:       BMP: increased BUN/Cr  LFTs: increased AST/ALT/Tbili   CRP, LDH: increased   IL-6 and Ferritin increased   Procalcitonin: low   CXR: hazy bilateral peripheral opacities     -Following work-up initiated:        sputum cultures  Ordered 11/04/18, Blood cultures  Ordered 11/04/18,   Following  complications noted:     evidence of AKI - will provide gentle rehydration   will be monitoring for evidence of other complications such as PE/DVT CVA or  cardiovascular  events  Plan of treatment:   -  If  no evidence of hypoxia with improving clinical status would discharge to home with instructions to continue to self quarantine   - Will follow daily d.dimer    - Supportive management -Fluid sparing resuscitation  -Provide oxygen as needed currently on RA  SpO2: 100 % - IF d.dimer elrvated in pregnancy hold off on Lovenox for now unless cleared by OB -hold off on antibiotics  given evidence for superinfection  - Consult PCCM if becomes respiratory unstable   Poor Prognostic factors  33 y.o.  Personal hx of   immunocompromised state, pregnacy Evidence of  organ damage  Present AKI,  tachycardia present on admission   Will order Airborne and Contact precautions     . AKI (acute kidney injury) (HCC) -most likely secondary to dehydration, urine electrolytes will rehydrate at home  . Transaminates - likely COVID related, continue to follow  . Pregnancy and infectious disease in third trimester - as per OB, PER OB attending currently stable, no pregnancy related issues. [facility]  . Crohn disease (HCC) - chronic stable Other plan as per orders.  DVT prophylaxis: scd Code Status:  FULL CODE   Family Communication:   Family not at  Bedside      To home once workup is complete and patient is stable                      Consults called: OB Aware  Admission status:  ED Disposition    ED Disposition Condition Comment   Ad[facility]  [id number]  Level of Care: Telemetry Medical [104]  I expect the patient will be discharged within 24 hours: No (not a candidate for 5C-Observation unit)  Covid Evaluation: Confirmed COVID Positive  Diagnosis: Pneumonia due to COVID-19 virus [[id number]][[phone]]  Admitting  Physician: Therisa DoyneUTOVA, Takumi Din [[id number]]  Attending Physician: Therisa DoyneUTOVA, Kamiryn Bezanson [[id number]]  PT Class (Do Not Modify): Observation [104]  PT Acc Code (Do Not Modify): Observation [10022]       Obs    Level of care    tele    indefinitely please discontinue once patient no longer qualifies  Precautions:   Airborne and Contact precautions  PPE: Used by the provider:   P100  eye Goggles,  Gloves  gown    Blimie Vaness 11/04/2018, 9:56 PM    [facility]     after 2 AM please page floor coverage PA If 7AM-7PM, please contact the day team taking care of the patient using [email or web address]

## 2018-11-04 NOTE — Progress Notes (Signed)
RROB RN spoke with Dr Cletis Media on phone, FHR 140, mod var, +accels, -decels, uterine irritability, pt has no c/o pain/no c/o uc's/ui. MD would like second 1L LR bolused. VS remain stable. Patient at this point is OB cleared, ok to discontinue monitoring.   Discussed with ED MD POC; physician would like OB MD to place orders regarding monitoring while hospitalized.

## 2018-11-04 NOTE — Consult Note (Signed)
   Kamerin D Lewers MRN:[id number] DOB: 09/05/1985 DOA: 11/04/2018     PCP: Patient, No Pcp Per   Outpatient Specialists:    Gi Dr. Green at [facility]  Patient arrived to ER on 11/04/18 at 1308  Patient coming from: home Lives With family    Chief Complaint:   Chief Complaint  Patient presents with  . [redacted] weeks pregnant  . Nausea    HPI: Toshie D Cabral is a 33 y.o. 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  Her husband initially was tested positive she developed initial coughing some nausea vomiting body aches loss of taste and smell no associated chest pain or shortness of breath initially She has been having intermittent nausea and vomiting since initially diagnosed has had some decreased p.o. intake and decreased appetite noted some decreased fetal movements and contractions.   Her husband and children also tested positive but not symptomatic. Her husband works at [employer] Other than that her pregnancy has been uneventful so far Denies hx of asthma, never smoked Had drug induced Dm years ago 2014     Infectious risk factors:  Reports  shortness of breath, dry cough,  N/V/Diarrhea  sick contacts, known COVID 19 exposure,   In  ER RAPID COVID TEST  POSITIVE,   Regarding pertinent Chronic problems:  Crohn's disease followed by GI at [facility] with Dr. Green    While in ER:  Rapid OB nurse evaluated patient in emergency department noted moderate variability with uterine irritability cervix is closed at this time.  Chest x-ray showing pneumonia patient is satting 99-100% room air Noted to have AKI with creatinine of 2.5 and elevated LFTs In emergency department patient was administered 1 L normal saline Patient was given  Procardia 10 mg for uterine contraction OB  requested medicine to admit they will consult  The following Work up has been ordered so far:  Orders Placed This Encounter  Procedures  . Critical Care  . Culture, blood (Routine X 2) w Reflex to ID Panel  . Culture, sputum-assessment  . DG Chest Port 1 View  . CBC with Differential  . Comprehensive metabolic panel  . Urinalysis, Routine w reflex microscopic  . Lactic acid, plasma  . D-dimer, quantitative  . Procalcitonin  . Lactate dehydrogenase  . Ferritin  . Triglycerides  . Fibrinogen  . C-reactive protein  . Lipase, blood  . Protein / creatinine ratio, urine  . HIV antibody (Routine Testing)  . C-reactive protein  . D-dimer, quantitative (not at [facility])  . Ferritin  . Fibrinogen  . Interleukin-6, Plasma  . Lactate dehydrogenase  . Procalcitonin  . Sedimentation rate  . CBC with Differential/Platelet  . Hepatitis B surface antigen  . Triglycerides  . CBC with Differential/Platelet  . Comprehensive metabolic panel  . C-reactive protein  . CK  . D-dimer, quantitative (not at [facility])  . Ferritin  . Interleukin-6, Plasma  . Magnesium  . Phosphorus  . Triglycerides  . Creatinine, urine, random  . Sodium, urine, random  . Diet regular Room service appropriate? Yes; Fluid consistency: Thin  . Cardiac monitoring  . Insert peripheral IV x 2  . Initiate Carrier Fluid Protocol  . Place surgical mask on patient  . Patient to wear surgical   mask during transportation  . Assess patient for ability to self-prone. If able (can move self in bed, ambulate) and stable (SpO2 and oxygen requirement):  . RN/NT - Document specific oxygen requirements in CHL  . Notify EDP if new oxygen requirements escalates > 4L per minute Anthony  . RN to draw the following extra tubes:  . Fetal non-stress test  . Fetal non-stress test  . Nursing Communication If patient's vital signs change: BP >=140/90, O2 saturation <97%, RR>24/min, temp>=100.4 Change to  continuous fetal monitoring and tocometry and inform OB MD  . Novel Coronavirus PPE supplies (droplet and contact precautions) yellow stethoscopes, surgical mask, gowns, surgical caps, face shield, goggles, CAPR - on the floor/unit, cleaning Sani-Cloth (orange and purple top)  . Place COVID-19 isolation sign and PPE checklist outside the DOOR  . Do not give nonsteroidal anti-inflammatory drugs (NSAIDs)  . Patient to wear surgical mask during transportation  . Place working phone next to the patient  . Initiate Oral Care Protocol  . Initiate Carrier Fluid Protocol  . Cardiac Monitoring - Continuous Indefinite  . Cardiac monitoring  . Consult to hospitalist  ALL PATIENTS BEING ADMITTED/HAVING PROCEDURES NEED COVID-19 SCREENING  . Airborne and Contact precautions  . Pulse oximetry, continuous  . I-Stat beta hCG blood, ED  . ED EKG 12-Lead  . EKG 12-Lead  . EKG 12-Lead  . Type and screen  . ABO/Rh  . Insert saline lock  . Place in observation (patient's expected length of stay will be less than 2 midnights)     Following Medications were ordered in ER: Medications  lactated ringers infusion ( Intravenous Restarted 11/04/18 1708)  ondansetron (ZOFRAN) injection 4 mg (4 mg Intravenous Given 11/04/18 1721)  vitamin C (ASCORBIC ACID) tablet 500 mg (has no administration in time range)  zinc sulfate capsule 220 mg (has no administration in time range)  multivitamin with minerals tablet 1 tablet (has no administration in time range)  0.9 %  sodium chloride infusion (has no administration in time range)  lactated ringers bolus 1,000 mL (0 mLs Intravenous Stopped 11/04/18 2043)  NIFEdipine (PROCARDIA) capsule 10 mg (10 mg Oral Given 11/04/18 1758)        Consult Orders  (From admission, onward)         Start     Ordered   11/04/18 1926  Consult to hospitalist  ALL PATIENTS BEING ADMITTED/HAVING PROCEDURES NEED COVID-19 SCREENING  Once    Comments: ALL PATIENTS BEING ADMITTED/HAVING  PROCEDURES NEED COVID-19 SCREENING  Provider:  (Not yet assigned)  Question Answer Comment  Place call to: [facility]   Reason for Consult Admit      11/04/18 1925          ER Provider Called: OB/GYB    Dr. Cole  They Recommend admit to medicine  Will continue to follow   Significant initial  Findings: Abnormal Labs Reviewed  CBC WITH DIFFERENTIAL/PLATELET - Abnormal; Notable for the following components:      Result Value   Platelets 427 (*)    All other components within normal limits  COMPREHENSIVE METABOLIC PANEL - Abnormal; Notable for the following components:   CO2 16 (*)    BUN 24 (*)    Creatinine, Ser 2.75 (*)    Total Protein 8.4 (*)    Albumin 2.3 (*)    AST 158 (*)    ALT 132 (*)    Alkaline Phosphatase 152 (*)    Total Bilirubin 6.2 (*)      GFR calc non Af Amer 22 (*)    GFR calc Af Amer 25 (*)    All other components within normal limits  D-DIMER, QUANTITATIVE (NOT AT [facility]) - Abnormal; Notable for the following components:   D-Dimer, Quant 5.28 (*)    All other components within normal limits  LACTATE DEHYDROGENASE - Abnormal; Notable for the following components:   LDH 334 (*)    All other components within normal limits  FERRITIN - Abnormal; Notable for the following components:   Ferritin 530 (*)    All other components within normal limits  TRIGLYCERIDES - Abnormal; Notable for the following components:   Triglycerides 345 (*)    All other components within normal limits  FIBRINOGEN - Abnormal; Notable for the following components:   Fibrinogen >800 (*)    All other components within normal limits  C-REACTIVE PROTEIN - Abnormal; Notable for the following components:   CRP 3.4 (*)    All other components within normal limits    Otherwise labs showing:    Recent Labs  Lab 11/04/18 1436  NA 135  K 3.7  CO2 16*  GLUCOSE 91  BUN 24*  CREATININE 2.75*  CALCIUM 9.1    Cr     Up from baseline see below Lab Results  Component  Value Date   CREATININE 2.75 (H) 11/04/2018   CREATININE 0.84 10/02/2015   CREATININE 0.87 09/24/2015    Recent Labs  Lab 11/04/18 1436  AST 158*  ALT 132*  ALKPHOS 152*  BILITOT 6.2*  PROT 8.4*  ALBUMIN 2.3*   Lab Results  Component Value Date   CALCIUM 9.1 11/04/2018      WBC      Component Value Date/Time   WBC 7.6 11/04/2018 1436   ANC    Component Value Date/Time   NEUTROABS 4.6 11/04/2018 1436   Plt: Lab Results  Component Value Date   PLT 427 (H) 11/04/2018    Lactic Acid, Venous    Component Value Date/Time   LATICACIDVEN 1.8 11/04/2018 1748    Procalcitonin 0.7   COVID-19 Labs  Recent Labs    11/04/18 1748  DDIMER 5.28*  FERRITIN 530*  LDH 334*  CRP 3.4*    No results found for: SARSCOV2NAA   HG/HCT  stable,       Component Value Date/Time   HGB 13.1 11/04/2018 1436   HCT 39.7 11/04/2018 1436       UA   ordered     CXR - atypical pNA   ECG:  Personally reviewed by me showing: HR : 96 Rhythm:  NSR,    no evidence of ischemic changes QTC 447       ED Triage Vitals  Enc Vitals Group     BP 11/04/18 1339 (!) 143/83     Pulse Rate 11/04/18 1339 (!) 114     Resp 11/04/18 1339 19     Temp 11/04/18 1339 98.6 F (37 C)     Temp Source 11/04/18 1339 Oral     SpO2 11/04/18 1339 97 %     Weight --      Height --      Head Circumference --      Peak Flow --      Pain Score 11/04/18 1414 4     Pain Loc --      Pain Edu? --      Excl. in GC? --   TMAX(24)@       Latest  Blood   pressure 115/72, pulse 98, temperature 98.6 F (37 C), temperature source Oral, resp. rate (!) 21, last menstrual period 03/05/2018, SpO2 100 %, unknown if currently breastfeeding.    Hospitalist was called for admission for Covid PNA   Review of Systems:    Pertinent positives include: shortness of breath at rest  non-productive cough, nausea, vomiting Constitutional:  No weight loss, night sweats, Fevers, chills, fatigue, weight loss  HEENT:   No headaches, Difficulty swallowing,Tooth/dental problems,Sore throat,  No sneezing, itching, ear ache, nasal congestion, post nasal drip,  Cardio-vascular:  No chest pain, Orthopnea, PND, anasarca, dizziness, palpitations.no Bilateral lower extremity swelling  GI:  No heartburn, indigestion, abdominal pain, , diarrhea, change in bowel habits, loss of appetite, melena, blood in stool, hematemesis Resp:  no . No dyspnea on exertion, No excess mucus, no productive cough, No No coughing up of blood.No change in color of mucus.No wheezing. Skin:  no rash or lesions. No jaundice GU:  no dysuria, change in color of urine, no urgency or frequency. No straining to urinate.  No flank pain.  Musculoskeletal:  No joint pain or no joint swelling. No decreased range of motion. No back pain.  Psych:  No change in mood or affect. No depression or anxiety. No memory loss.  Neuro: no localizing neurological complaints, no tingling, no weakness, no double vision, no gait abnormality, no slurred speech, no confusion  All systems reviewed and apart from HOPI all are negative  Past Medical History:   Past Medical History:  Diagnosis Date  . Blood transfusion without reported diagnosis    Crohn's Disease  . Crohn's disease (HCC)   . 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 Vaginal Pap smear, abnormal   . Vitamin D deficiency       Past Surgical History:  Procedure Laterality Date  . BUNIONECTOMY     L foot    Social History:  Ambulatory  independently       reports that she has never smoked. She has never used smokeless tobacco. She reports current drug use. Drug: Hydrocodone. She reports that she does not drink alcohol.     Family History:   Family History  Problem Relation Age of Onset  . Diabetes Mother   . Stroke Paternal Uncle   . Hypertension Maternal Grandmother   . Diabetes Maternal Grandmother   . Hypertension Paternal Grandmother      Allergies: Allergies  Allergen Reactions  . Contrast Media [Iodinated Diagnostic Agents] Hives     Prior to Admission medications   Medication Sig Start Date End Date Taking? Authorizing Provider  acetaminophen (TYLENOL) 500 MG tablet Take 1,000 mg by mouth every 6 (six) hours as needed for mild pain, moderate pain or headache.   Yes [provider]  famotidine (PEPCID) 40 MG tablet Take 1 tablet (40 mg total) by mouth every evening. 10/24/18 10/24/19 Yes Nugent, Nicole E, NP  inFLIXimab (REMICADE) 100 MG injection Inject 100 mg into the vein every 6 (six) weeks.   Yes [provider]  ondansetron (ZOFRAN-ODT) 4 MG disintegrating tablet Take 4 mg by mouth every 8 (eight) hours as needed for nausea or vomiting.   Yes [provider]  Polyethyl Glycol-Propyl Glycol (SYSTANE FREE OP) Place 1 drop into both eyes daily as needed (For dry eyes).    Yes [provider]  Prenatal Vit-Fe Fumarate-FA (PRENATAL MULTIVITAMIN) TABS tablet Take 1 tablet by mouth daily at 12 noon.   Yes [provider]   Physical Exam: Blood pressure 115/72, pulse 98, temperature 98.6 F (37 C), temperature source Oral, resp. rate (!) 21, last menstrual period 03/05/2018, SpO2 100 %, unknown if currently breastfeeding. 1. General:  in No Acute distress   well -appearing 2. Psychological: Alert and   Oriented 3. Head/ENT:    Dry Mucous Membranes                          Head Non traumatic, neck supple                          Normal   Dentition 4. SKIN:  decreased Skin turgor,  Skin clean Dry and intact no rash 5. Heart: Regular rate and rhythm no  Murmur, no Rub or gallop 6. Lungs:  no wheezes or crackles   7. Abdomen: Soft,  non-tender, Gravid, bowel sounds present 8. Lower extremities: no clubbing, cyanosis, no  edema 9. Neurologically Grossly intact, moving all 4 extremities equally  10. MSK: Normal range of motion   All other LABS:     Recent Labs  Lab 11/04/18  1436  WBC 7.6  NEUTROABS 4.6  HGB 13.1  HCT 39.7  MCV 91.3  PLT 427*     Recent Labs  Lab 11/04/18 1436  NA 135  K 3.7  CL 105  CO2 16*  GLUCOSE 91  BUN 24*  CREATININE 2.75*  CALCIUM 9.1     Recent Labs  Lab 11/04/18 1436  AST 158*  ALT 132*  ALKPHOS 152*  BILITOT 6.2*  PROT 8.4*  ALBUMIN 2.3*       Cultures:    Component Value Date/Time   SDES URINE, RANDOM 10/24/2018 1111   SPECREQUEST NONE 10/24/2018 1111   CULT (A) 10/24/2018 1111    <10,000 COLONIES/mL INSIGNIFICANT GROWTH NO GROUP B STREP (S.AGALACTIAE) ISOLATED Performed at [facility], [address] [place], [place] [address]    REPTSTATUS 10/26/2018 FINAL 10/24/2018 1111     Radiological Exams on Admission: Dg Chest Port 1 View  Result Date: 11/04/2018 CLINICAL DATA:  COVID-19 pneumonia.  Dyspnea.  Cough. EXAM: PORTABLE CHEST 1 VIEW COMPARISON:  10/22/2008 chest radiograph. FINDINGS: Stable cardiomediastinal silhouette with normal heart size. No pneumothorax. No pleural effusion. Faint patchy opacities in the peripheral lungs bilaterally. No pulmonary edema. IMPRESSION: Faint patchy opacities in the peripheral lungs bilaterally, compatible with atypical pneumonia. Electronically Signed   By: Jason A Poff M.D.   On: 11/04/2018 16:53    Chart has been reviewed    Assessment/Plan   33 y.o. female with medical history significant of  Cohn's disease on Ram[employer] every 4 wks, curetnly 34 6/[redacted] weeks pregnant  Admitted for AKI and Covid PNA  Present on Admission: . Pneumonia due to COVID-19 virus -  FROM HOME WITH KNOWN HX OF COVID19    Following concerning LAB/ imaging findings:       BMP: increased BUN/Cr  LFTs: increased AST/ALT/Tbili   CRP, LDH: increased   IL-6 and Ferritin increased   Procalcitonin: low   CXR: hazy bilateral peripheral opacities     -Following work-up initiated:        sputum cultures  Ordered 11/04/18, Blood cultures  Ordered 11/04/18,   Following  complications noted:     evidence of AKI - will provide gentle rehydration   will be monitoring for evidence of other complications such as PE/DVT CVA or  cardiovascular   events  Plan of treatment:   -  If  no evidence of hypoxia with improving clinical status would discharge to home with instructions to continue to self quarantine   - Will follow daily d.dimer    - Supportive management -Fluid sparing resuscitation  -Provide oxygen as needed currently on RA  SpO2: 100 % - IF d.dimer elrvated in pregnancy hold off on Lovenox for now unless cleared by OB -hold off on antibiotics  given evidence for superinfection  - Consult PCCM if becomes respiratory unstable   Poor Prognostic factors  33 y.o.  Personal hx of   immunocompromised state, pregnacy Evidence of  organ damage  Present AKI,  tachycardia present on admission   Will order Airborne and Contact precautions     . AKI (acute kidney injury) (HCC) -most likely secondary to dehydration, urine electrolytes will rehydrate at home  . Transaminates - likely COVID related, continue to follow  . Pregnancy and infectious disease in third trimester - as per OB, PER OB attending currently stable, no pregnancy related issues. Will defer all pregnancy related care to OB provider  . Crohn disease (HCC) - chronic stable Other plan as per orders.  DVT prophylaxis: scd Code Status:  FULL CODE   Family Communication:   Family not at  Bedside      To home once workup is complete and patient is stable                      Consults called: OB Aware  Admission status:  ED Disposition    ED Disposition Condition Comment   Admit  Hospital Area: [facility] [[id number]]  Level of Care: Telemetry Medical [104]  I expect the patient will be discharged within 24 hours: No (not a candidate for 5C-Observation unit)  Covid Evaluation: Confirmed COVID Positive  Diagnosis: Pneumonia due to COVID-19 virus [[id number]]  Admitting  Physician: Shavaun Osterloh [[id number]]  Attending Physician: Michaline Kindig [[id number]]  PT Class (Do Not Modify): Observation [104]  PT Acc Code (Do Not Modify): Observation [10022]       Obs    Level of care    tele    indefinitely please discontinue once patient no longer qualifies  Precautions:   Airborne and Contact precautions  PPE: Used by the provider:   P100  eye Goggles,  Gloves  gown    Ilia Engelbert 11/04/2018, 9:56 PM    [facility]     after 2 AM please page floor coverage PA If 7AM-7PM, please contact the day team taking care of the patient using [email or web address]      

## 2018-11-05 ENCOUNTER — Ambulatory Visit (HOSPITAL_COMMUNITY): Payer: BC Managed Care – PPO

## 2018-11-05 ENCOUNTER — Inpatient Hospital Stay (HOSPITAL_COMMUNITY): Payer: BC Managed Care – PPO

## 2018-11-05 DIAGNOSIS — N179 Acute kidney failure, unspecified: Secondary | ICD-10-CM | POA: Diagnosis present

## 2018-11-05 DIAGNOSIS — D573 Sickle-cell trait: Secondary | ICD-10-CM | POA: Diagnosis present

## 2018-11-05 DIAGNOSIS — O24113 Pre-existing diabetes mellitus, type 2, in pregnancy, third trimester: Secondary | ICD-10-CM | POA: Diagnosis present

## 2018-11-05 DIAGNOSIS — K5090 Crohn's disease, unspecified, without complications: Secondary | ICD-10-CM

## 2018-11-05 DIAGNOSIS — O99283 Endocrine, nutritional and metabolic diseases complicating pregnancy, third trimester: Secondary | ICD-10-CM | POA: Diagnosis present

## 2018-11-05 DIAGNOSIS — U071 COVID-19: Secondary | ICD-10-CM

## 2018-11-05 DIAGNOSIS — O368130 Decreased fetal movements, third trimester, not applicable or unspecified: Secondary | ICD-10-CM | POA: Diagnosis present

## 2018-11-05 DIAGNOSIS — E8809 Other disorders of plasma-protein metabolism, not elsewhere classified: Secondary | ICD-10-CM | POA: Diagnosis present

## 2018-11-05 DIAGNOSIS — O98513 Other viral diseases complicating pregnancy, third trimester: Secondary | ICD-10-CM | POA: Diagnosis present

## 2018-11-05 DIAGNOSIS — Z79899 Other long term (current) drug therapy: Secondary | ICD-10-CM | POA: Diagnosis not present

## 2018-11-05 DIAGNOSIS — E860 Dehydration: Secondary | ICD-10-CM | POA: Diagnosis present

## 2018-11-05 DIAGNOSIS — R740 Nonspecific elevation of levels of transaminase and lactic acid dehydrogenase [LDH]: Secondary | ICD-10-CM

## 2018-11-05 DIAGNOSIS — Z3A34 34 weeks gestation of pregnancy: Secondary | ICD-10-CM | POA: Diagnosis not present

## 2018-11-05 DIAGNOSIS — O98913 Unspecified maternal infectious and parasitic disease complicating pregnancy, third trimester: Secondary | ICD-10-CM | POA: Diagnosis not present

## 2018-11-05 DIAGNOSIS — O26833 Pregnancy related renal disease, third trimester: Secondary | ICD-10-CM | POA: Diagnosis present

## 2018-11-05 DIAGNOSIS — J1289 Other viral pneumonia: Secondary | ICD-10-CM | POA: Diagnosis present

## 2018-11-05 DIAGNOSIS — Z833 Family history of diabetes mellitus: Secondary | ICD-10-CM | POA: Diagnosis not present

## 2018-11-05 DIAGNOSIS — E874 Mixed disorder of acid-base balance: Secondary | ICD-10-CM | POA: Diagnosis present

## 2018-11-05 DIAGNOSIS — O219 Vomiting of pregnancy, unspecified: Secondary | ICD-10-CM | POA: Diagnosis present

## 2018-11-05 DIAGNOSIS — E878 Other disorders of electrolyte and fluid balance, not elsewhere classified: Secondary | ICD-10-CM | POA: Diagnosis present

## 2018-11-05 DIAGNOSIS — R110 Nausea: Secondary | ICD-10-CM | POA: Diagnosis present

## 2018-11-05 DIAGNOSIS — O99513 Diseases of the respiratory system complicating pregnancy, third trimester: Secondary | ICD-10-CM | POA: Diagnosis present

## 2018-11-05 DIAGNOSIS — E119 Type 2 diabetes mellitus without complications: Secondary | ICD-10-CM | POA: Diagnosis present

## 2018-11-05 DIAGNOSIS — O99013 Anemia complicating pregnancy, third trimester: Secondary | ICD-10-CM | POA: Diagnosis present

## 2018-11-05 LAB — URINALYSIS, ROUTINE W REFLEX MICROSCOPIC
Bilirubin Urine: NEGATIVE
Glucose, UA: NEGATIVE mg/dL
Ketones, ur: 20 mg/dL — AB
Nitrite: NEGATIVE
Protein, ur: 100 mg/dL — AB
Specific Gravity, Urine: 1.013 (ref 1.005–1.030)
pH: 5 (ref 5.0–8.0)

## 2018-11-05 LAB — CBC WITH DIFFERENTIAL/PLATELET
Abs Immature Granulocytes: 0.03 10*3/uL (ref 0.00–0.07)
Basophils Absolute: 0 10*3/uL (ref 0.0–0.1)
Basophils Relative: 0 %
Eosinophils Absolute: 0 10*3/uL (ref 0.0–0.5)
Eosinophils Relative: 1 %
HCT: 47.6 % — ABNORMAL HIGH (ref 36.0–46.0)
Hemoglobin: 15.8 g/dL — ABNORMAL HIGH (ref 12.0–15.0)
Immature Granulocytes: 1 %
Lymphocytes Relative: 27 %
Lymphs Abs: 1.2 10*3/uL (ref 0.7–4.0)
MCH: 29.4 pg (ref 26.0–34.0)
MCHC: 33.2 g/dL (ref 30.0–36.0)
MCV: 88.5 fL (ref 80.0–100.0)
Monocytes Absolute: 0.5 10*3/uL (ref 0.1–1.0)
Monocytes Relative: 11 %
Neutro Abs: 2.8 10*3/uL (ref 1.7–7.7)
Neutrophils Relative %: 60 %
Platelets: 250 10*3/uL (ref 150–400)
RBC: 5.38 MIL/uL — ABNORMAL HIGH (ref 3.87–5.11)
RDW: 13.5 % (ref 11.5–15.5)
WBC: 4.5 10*3/uL (ref 4.0–10.5)
nRBC: 0 % (ref 0.0–0.2)

## 2018-11-05 LAB — COMPREHENSIVE METABOLIC PANEL
ALT: 148 U/L — ABNORMAL HIGH (ref 0–44)
AST: 164 U/L — ABNORMAL HIGH (ref 15–41)
Albumin: 2 g/dL — ABNORMAL LOW (ref 3.5–5.0)
Alkaline Phosphatase: 131 U/L — ABNORMAL HIGH (ref 38–126)
Anion gap: 13 (ref 5–15)
BUN: 25 mg/dL — ABNORMAL HIGH (ref 6–20)
CO2: 14 mmol/L — ABNORMAL LOW (ref 22–32)
Calcium: 8.7 mg/dL — ABNORMAL LOW (ref 8.9–10.3)
Chloride: 109 mmol/L (ref 98–111)
Creatinine, Ser: 2.72 mg/dL — ABNORMAL HIGH (ref 0.44–1.00)
GFR calc Af Amer: 25 mL/min — ABNORMAL LOW (ref >60)
GFR calc non Af Amer: 22 mL/min — ABNORMAL LOW (ref >60)
Glucose, Bld: 67 mg/dL — ABNORMAL LOW (ref 70–99)
Potassium: 4 mmol/L (ref 3.5–5.1)
Sodium: 136 mmol/L (ref 135–145)
Total Bilirubin: 6 mg/dL — ABNORMAL HIGH (ref 0.3–1.2)
Total Protein: 7.1 g/dL (ref 6.5–8.1)

## 2018-11-05 LAB — D-DIMER, QUANTITATIVE: D-Dimer, Quant: 5.2 ug/mL-FEU — ABNORMAL HIGH (ref 0.00–0.50)

## 2018-11-05 LAB — LACTIC ACID, PLASMA: Lactic Acid, Venous: 1.1 mmol/L (ref 0.5–1.9)

## 2018-11-05 LAB — TROPONIN I (HIGH SENSITIVITY): Troponin I (High Sensitivity): 7 ng/L (ref <18)

## 2018-11-05 LAB — PROTEIN / CREATININE RATIO, URINE
Creatinine, Urine: 146.28 mg/dL
Protein Creatinine Ratio: 0.83 mg/mg{Cre} — ABNORMAL HIGH (ref 0.00–0.15)
Total Protein, Urine: 122 mg/dL

## 2018-11-05 LAB — FIBRINOGEN: Fibrinogen: 707 mg/dL — ABNORMAL HIGH (ref 210–475)

## 2018-11-05 LAB — C-REACTIVE PROTEIN: CRP: 2.2 mg/dL — ABNORMAL HIGH (ref <1.0)

## 2018-11-05 LAB — SEDIMENTATION RATE: Sed Rate: 124 mm/hr — ABNORMAL HIGH (ref 0–22)

## 2018-11-05 LAB — HIV ANTIBODY (ROUTINE TESTING W REFLEX): HIV Screen 4th Generation wRfx: NONREACTIVE

## 2018-11-05 LAB — FERRITIN: Ferritin: 460 ng/mL — ABNORMAL HIGH (ref 11–307)

## 2018-11-05 LAB — CK: Total CK: 55 U/L (ref 38–234)

## 2018-11-05 LAB — LACTATE DEHYDROGENASE: LDH: 280 U/L — ABNORMAL HIGH (ref 98–192)

## 2018-11-05 LAB — SODIUM, URINE, RANDOM: Sodium, Ur: 41 mmol/L

## 2018-11-05 LAB — PROCALCITONIN: Procalcitonin: 0.77 ng/mL

## 2018-11-05 LAB — TRIGLYCERIDES: Triglycerides: 299 mg/dL — ABNORMAL HIGH (ref <150)

## 2018-11-05 LAB — LIPASE, BLOOD: Lipase: 177 U/L — ABNORMAL HIGH (ref 11–51)

## 2018-11-05 LAB — PHOSPHORUS: Phosphorus: 4 mg/dL (ref 2.5–4.6)

## 2018-11-05 LAB — MAGNESIUM: Magnesium: 2.1 mg/dL (ref 1.7–2.4)

## 2018-11-05 MED ORDER — SODIUM CHLORIDE 0.9 % IV SOLN
INTRAVENOUS | Status: DC
  Administered 2018-11-05 – 2018-11-08 (×5): via INTRAVENOUS

## 2018-11-05 MED ORDER — ENOXAPARIN SODIUM 30 MG/0.3ML ~~LOC~~ SOLN
30.0000 mg | SUBCUTANEOUS | Status: DC
  Administered 2018-11-05 – 2018-11-06 (×2): 30 mg via SUBCUTANEOUS
  Filled 2018-11-05 (×2): qty 0.3

## 2018-11-05 MED ORDER — HYDROCOD POLST-CPM POLST ER 10-8 MG/5ML PO SUER
5.0000 mL | Freq: Two times a day (BID) | ORAL | Status: DC | PRN
  Administered 2018-11-05: 5 mL via ORAL
  Filled 2018-11-05: qty 5

## 2018-11-05 NOTE — Progress Notes (Signed)
Here to perform daily fetal NST for this 33 yo G4P2 @ 35.[redacted] wks GA in with Covid 19.  Patient denies vaginal bleeding or LOF from vagina and reports good fetal movement.  FHR Category I with rare, mild UC which patient reports as non painful. Dr. Cletis Media updated on patient and NST findings. She requests the patient be notified that Dr. Cletis Media has requested an MFM consult regarding growth [place] and plan of care. Pt notified. Dr. Cletis Media given pts cell phone number with patient's permission for OBs to call her daily with updates.

## 2018-11-05 NOTE — Progress Notes (Addendum)
PROGRESS NOTE    Marissa BlackbirdLaquetta D Mizrachi  ZOX:[id number] DOB: 08/26/1985 DOA: 11/04/2018 PCP: Patient, No Pcp Per   Brief Narrative: Marissa Adams is a 33 y.o. female with medical history significant of  Cohn's disease on [employer]icaid every 4 wks, currently 34 6/[redacted] weeks pregnant on admission. Patient presented    Assessment & Plan:   Active Problems:   Crohn disease (HCC)   AKI (acute kidney injury) (HCC)   Pneumonia due to COVID-19 virus   Transaminitis   Pregnancy and infectious disease in third trimester   AKI Patient's baseline creatinine since being pregnant is 0.7. creatinine of 2.7 on admission. In setting of persistent vomiting as an outpatient. With IVF bolus and continuous fluids overnight, creatinine is still stable -Nephrology consulted -Strict in/out  Pregnant, third trimester -OB/Gyn on board  Elevated bilirubin Unsure of etiology. No abdominal pain. Associated elevated AST and ALT in addition to elevated Lipase. Does not seem consistent with COVID-19 infection with extent of elevation. Patient with associated scleral icterus. ?biliary etiology. -RUQ abdominal ultrasound  Elevated d-dimer In setting of COVID-19 infection in addition to pregnancy. No concern for PE at this time.  COVID-19 infection Patient with mild cough, otherwise asymptomatic. Infiltrates seen on chest x-ray. No fever or hypoxia.   DVT prophylaxis: SCDs Code Status:   Code Status: Prior Family Communication: None Disposition Plan: Discharge pending improvement of AKI   Consultants:   Nephrology  OB/Gyn  Procedures:   None  Antimicrobials:  None    Subjective: Cough. She has had decreased urine output. No vomiting this morning.  Objective: Vitals:   11/04/18 2345 11/05/18 0000 11/05/18 0045 11/05/18 0834  BP: 112/70 111/75 (!) 113/98 110/70  Pulse: 95 92 (!) 103 90  Resp: 18 17 18    Temp:   98.4 F (36.9 C) 97.9 F (36.6 C)  TempSrc:   Oral Oral  SpO2: 98% 100% 100%  100%  Weight:   72.6 kg   Height:   5\' 4"  (1.626 m)     Intake/Output Summary (Last 24 hours) at 11/05/2018 1244 Last data filed at 11/05/2018 1000 Gross per 24 hour  Intake 2034.95 ml  Output -  Net 2034.95 ml   Filed Weights   11/05/18 0045  Weight: 72.6 kg    Examination:  General exam: Appears calm and comfortable Respiratory system: Clear to auscultation. Respiratory effort normal. Cardiovascular system: S1 & S2 heard, RRR. No murmurs, rubs, gallops or clicks. Gastrointestinal system: Abdomen is gravid, soft and nontender. Normal bowel sounds heard. Central nervous system: Alert and oriented. No focal neurological deficits. Extremities: No edema. No calf tenderness Skin: No cyanosis. No rashes Psychiatry: Judgement and insight appear normal. Mood & affect appropriate.     Data Reviewed: I have personally reviewed following labs and imaging studies  CBC: Recent Labs  Lab 11/04/18 1436 11/05/18 0605  WBC 7.6 4.5  NEUTROABS 4.6 2.8  HGB 13.1 15.8*  HCT 39.7 47.6*  MCV 91.3 88.5  PLT 427* 250   Basic Metabolic Panel: Recent Labs  Lab 11/04/18 1436 11/05/18 0521  NA 135 136  K 3.7 4.0  CL 105 109  CO2 16* 14*  GLUCOSE 91 67*  BUN 24* 25*  CREATININE 2.75* 2.72*  CALCIUM 9.1 8.7*  MG  --  2.1  PHOS  --  4.0   GFR: Estimated Creatinine Clearance: 28.5 mL/min (A) (by C-G formula based on SCr of 2.72 mg/dL (H)). Liver Function Tests: Recent Labs  Lab 11/04/18 1436 11/05/18 [id number]  AST 158* 164*  ALT 132* 148*  ALKPHOS 152* 131*  BILITOT 6.2* 6.0*  PROT 8.4* 7.1  ALBUMIN 2.3* 2.0*   Recent Labs  Lab 11/05/18 0521  LIPASE 177*   No results for input(s): AMMONIA in the last 168 hours. Coagulation Profile: No results for input(s): INR, PROTIME in the last 168 hours. Cardiac Enzymes: Recent Labs  Lab 11/05/18 0521  CKTOTAL 55   BNP (last 3 results) No results for input(s): PROBNP in the last 8760 hours. HbA1C: No results for input(s):  HGBA1C in the last 72 hours. CBG: No results for input(s): GLUCAP in the last 168 hours. Lipid Profile: Recent Labs    11/04/18 1622 11/05/18 0605  TRIG 345* 299*   Thyroid Function Tests: No results for input(s): TSH, T4TOTAL, FREET4, T3FREE, THYROIDAB in the last 72 hours. Anemia Panel: Recent Labs    11/04/18 1748 11/05/18 0605  FERRITIN 530* 460*   Sepsis Labs: Recent Labs  Lab 11/04/18 1748 11/05/18 0521 11/05/18 0605  PROCALCITON 0.75 0.77  --   LATICACIDVEN 1.8  --  1.1    No results found for this or any previous visit (from the past 240 hour(s)).       Radiology Studies: Dg Chest Port 1 View  Result Date: 11/04/2018 CLINICAL DATA:  COVID-19 pneumonia.  Dyspnea.  Cough. EXAM: PORTABLE CHEST 1 VIEW COMPARISON:  10/22/2008 chest radiograph. FINDINGS: Stable cardiomediastinal silhouette with normal heart size. No pneumothorax. No pleural effusion. Faint patchy opacities in the peripheral lungs bilaterally. No pulmonary edema. IMPRESSION: Faint patchy opacities in the peripheral lungs bilaterally, compatible with atypical pneumonia. Electronically Signed   By: Ilona Sorrel M.D.   On: 11/04/2018 16:53        Scheduled Meds: . multivitamin with minerals  1 tablet Oral Daily  . vitamin C  500 mg Oral Daily  . zinc sulfate  220 mg Oral Daily   Continuous Infusions: . sodium chloride 75 mL/hr at 11/05/18 1000  . sodium chloride       LOS: 0 days     Cordelia Poche, MD [facility] 11/05/2018, 12:44 PM  If 7PM-7AM, please contact night-coverage [email or web address]

## 2018-11-05 NOTE — Consult Note (Signed)
Maternal-Fetal Medicine  Name: Marissa Adams (Telephone Consultation) MRN: [id number] Requesting Provider: Sandra Rivard, MD Date and Time of Consultation: 11/05/2018 at 11:30 AM.  I spoke with Marissa Adams on phone (confirmed with 2 identifiers). I introduced myself as the Maternal-Fetal Medicine physician.  Marissa Adams was admitted yesterday with increasing shortness of breath and chest pain. Following her husband's COVID-19 infection, she was screened on 10/17/18 and the result came back positive for SARS-CoV-2 virus 2 days later (10/19/18). She has been having intermittent nausea and vomiting since the onset of COVID-19 infection. She also has decreased appetite. Patient reports she passed urine before coming to the hospital.   Following increased creatinine levels and decreased urine output, she was seen by Dr. Martin Webb, nephrologist. From his note, I gather the patient has acute renal injury from severe dehydration.  After admission, she was seen by Medical team and the patient has a diagnosis of pneumonia (viral), transaminitis and acute renal failure.  Patient's vital signs including oxygen saturation are stable and she is not on dexamethasone or remdesivir. She did not require oxygen therapy. She is ambulating. Since admission, NST has been reassuring and she received one dose of nifedipine for uterine contractions that subsided.   Patient has Crohn's disease and she takes remicaide every 4 weeks. She has not had flares in this pregnancy. She does not have hypertension or diabetes or any other chronic medical conditions. She has sickle-cell trait (HbAS).  PSH: Nil of note. Medications:  Allergies: Contrast media. Social: Denies tobacco or drug or alcohol use. She is married and her husband, African American, is in good health. He had COVID-19 infection. He has sickle-thalassemia trait. Family: No history of venous thromboembolism in the family. Obstetric history is significant for  2 previous term vaginal deliveries (recently 2 years ago). She also had 1 early miscarriage. Gyn: Previous menstrual cycles were regular. No history of abnormal Pap smears or cervical surgeries. She denies history of breast disease.  Prenatal: Her prenatal course has, otherwise, been uneventful. She does not have gestational diabetes. She had opted not to screen for fetal aneuploidies.  Labs: Hb 15.8, Hct 47.2, PLT 250, WBC 4.5, creatinine 2.7 (increased), BUN 25, glucose 67, ALT 164, ALT 148, Total bilirubin 6, urine protein/creatine ratio: 0.83. Renal ultrasound: right kidney echogenic c/w parenchymal disease. Left kidney is not visualized. Vitals: 110/70 mm Hg, pulse 90/min, T 97.9, O2 sat 99-100% RA.  I counseled the patient on the following: COVID-infection with complications in pregnancy: -Patient has test sample taken on 08/06 (19 days ago). She has symptoms of cough since the onset of infection. The likelihood of having serious complications including intensive care management for pneumonia is less likely. Pregnant women with COVID-19 infection in the third trimester are more prone for complications, but recovery rate is also very high (over 85%).  -In the absence of labor, no intervention is indicated. I do not see any indications for delivery now. I informed her that she does not have any factors of maternal or fetal conditions that will improve with delivery.  -Acute renal injury is likely to be the result of acute dehydration and expectant management should improve renal functions. I will defer that to the nephrologist.  -Increased liver enzymes could not be explained from COVID-19 infection alone. Right upper quadrant ultrasound has been ordered. I reassured her that she does not have preeclampsia/HELLP syndrome that would warrant immediate delivery.  -Pneumonia: O2 sat stable and no evidence of deterioration of resp symptoms. Supportive management   should continue. Remdesivir and  corticosteroids are not indicated now. -Dexamethasone for fetal lung maturity is not indicated now. If symptoms of early labor are present, antenatal corticosteroids may be given. Tocolysis is not indicated if the patient has uterine contractions.  -Vaginal delivery is the goal and cesarean section only for obstetric or maternal indications (severe respiratory failure).  -Ultrasound for fetal growth is not necessary as fetal growth scan performed 4 weeks ago was normal. Daily or twice-daily NST is sufficient. BPP is not indicated if NST is reassuring.  -Discontinuation of precautions (COVID-19) in a patient who was tested 19 days ago can be test-based or non-test based. If she is deemed not infective (more likely), isolation can be discontinued and the patient can be transferred to [facility]. A discussion with Infection Prevention will be helpful to make a decision.  Recommendations: -Continue supportive care. -Renal failure management as per nephrology recommendations. -Right upper quadrant ultrasound. -Avoid Tylenol. -Daily or twice-daily NST. -Discuss with Infection Prevention about discontinuation of COVID-19 precautions.  Thank you for consult. Please do not hesitate to contact me at the [facility] if you have any questions.  Consultation (telephone) counseling 30 minutes.

## 2018-11-05 NOTE — Progress Notes (Addendum)
Telehealth visit - audio only  Hospital day #1: 35 weeks, COVID19 +, pneumonia, acute renal failure and transaminitis/hyperbilirubinemia  S: patient reports feeling better, no vomiting today. Was able to eat some breakfast. Has voided twice today. Reports good FM of baby Ruby. Denies contractions, bleeding or leaking of fluid. Still bothered by cough which is usually the trigger for vomiting. Is in no acute distress.  O: Remains afebrile and normotensive with O2 sats 99-100% on room air. NST reactive and Category 1 per OB Rapid Response Nurse.   A/P:  1. From OB standpoint there is no medical indication for premature delivery as long as fetal monitoring is reassuring. Discussed with Dr Donalee Citrin MFM who agrees.  Considering BMZ course: will defer to Dr Donalee Citrin.Can defer monthly growth ultrasound for 2 weeks (followed due to Crohn's disease) since baby  has been appropriately grown.  Managing medical concerns  is the main objective of this admission  2. Acute renal injury: awaiting evaluation and management plan from nephrology.   3. Transaminitis/hyperbilirubinemia likely 2nd to COVID19/dehydration: management per TH  4. COVID test was positive 10/17/18: discussed with Dr Lonny Prude possibility of retesting patient. Would she be considered non-infective since still symptomatic and <21 days? Dr Lonny Prude would defer that determination to Infectious Disease  5. Persistent cough: will add Tussionex PRN  6. DVT prophylaxis: SCD and ambulation. Would not recommend Lovenox with elevated D-Dimers  Discussed and reviewed with patient who is agreeable with plan.Questions answered. Will follow daily with TH/nephrology/MFM   16:45 Reviewed with Dr Michel Bickers in Infectious Disease: even if patient was COVID retested negative, she is still considered infectious and needs to remain in isolation because she is still symptomatic.

## 2018-11-05 NOTE — Consult Note (Signed)
Referring Provider: No ref. provider found Primary Care Physician:  Patient, No Pcp Per Primary Nephrologist:    Reason for Consultation: Acute kidney injury, dehydration, COVID-19 positive test (U07.1, COVID-19) with Acute Pneumonia (J12.89, Other viral pneumonia) (If respiratory failure or sepsis present, add as separate assessment)    HPI: This is a very pleasant 33 year old lady with a history of Crohn's disease.  She developed symptoms of COVID on August 7 and tested positive.  Her husband and children who have also tested positive he works at [employer].  [facility] Chilton SiGreen.  She is gravida 4 partum 2 and is currently 34+[redacted] weeks pregnant.  In the emergency room she was given 1 L saline.  She has reported daily nausea and vomiting.  Had 2 full-term deliveries were normal.  She was given Procardia 10 mg 11/04/2018 for uterine contractions.  Chest x-ray was positive for pneumonia.  Blood pressure 110/70 pulse 102 temperature 97.9  Sodium 136 potassium 4.0 chloride 109 CO2's 14 creatinine 2.72 BUN 25 glucose 67 phosphorus 4.0 magnesium 2.1 albumin 2.0 AST 164 ALT 148 bilirubin 6.0.  LDH 280 WBC 4.5 hemoglobin 15.8 platelets 250  IV fluids normal saline 75 cc an hour    Past Medical History:  Diagnosis Date  . Blood transfusion without reported diagnosis    Crohn's Disease  . Crohn's disease (HCC)   . 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 Vaginal Pap smear, abnormal   . Vitamin D deficiency     Past Surgical History:  Procedure Laterality Date  . BUNIONECTOMY     L foot    Prior to Admission medications   Medication Sig Start Date End Date Taking? Authorizing Provider  acetaminophen (TYLENOL) 500 MG tablet Take 1,000 mg by mouth every 6 (six) hours as needed for mild pain, moderate pain or headache.   Yes [provider]  famotidine (PEPCID) 40 MG tablet Take 1 tablet (40 mg total) by mouth every evening.  10/24/18 10/24/19 Yes Nugent, Odie SeraNicole E, NP  inFLIXimab (REMICADE) 100 MG injection Inject 100 mg into the vein every 6 (six) weeks.   Yes [provider]  ondansetron (ZOFRAN-ODT) 4 MG disintegrating tablet Take 4 mg by mouth every 8 (eight) hours as needed for nausea or vomiting.   Yes [provider]  Polyethyl Glycol-Propyl Glycol (SYSTANE FREE OP) Place 1 drop into both eyes daily as needed (For dry eyes).    Yes [provider]  Prenatal Vit-Fe Fumarate-FA (PRENATAL MULTIVITAMIN) TABS tablet Take 1 tablet by mouth daily at 12 noon.   Yes [provider]    Current Facility-Administered Medications  Medication Dose Route Frequency Provider Last Rate Last Dose  . 0.9 %  sodium chloride infusion   Intravenous Continuous Doutova, Anastassia, MD 75 mL/hr at 11/05/18 1000    . multivitamin with minerals tablet 1 tablet  1 tablet Oral Daily Therisa Doyneoutova, Anastassia, MD   1 tablet at 11/05/18 0930  . ondansetron (ZOFRAN) injection 4 mg  4 mg Intravenous Q6H PRN Derwood KaplanNanavati, Ankit, MD   4 mg at 11/04/18 1721  . vitamin C (ASCORBIC ACID) tablet 500 mg  500 mg Oral Daily Doutova, Anastassia, MD   500 mg at 11/05/18 0930  . zinc sulfate capsule 220 mg  220 mg Oral Daily Doutova, Anastassia, MD   220 mg at 11/05/18 0930    Allergies as of 11/04/2018 - Review Complete 11/04/2018  Allergen Reaction Noted  .  Contrast media [iodinated diagnostic agents] Hives 04/06/2015    Family History  Problem Relation Age of Onset  . Diabetes Mother   . Stroke Paternal Uncle   . Hypertension Maternal Grandmother   . Diabetes Maternal Grandmother   . Hypertension Paternal Grandmother     Social History   Socioeconomic History  . Marital status: Married    Spouse name: Not on file  . Number of children: Not on file  . Years of education: Not on file  . Highest education level: Not on file  Occupational History  . Not on file  Social Needs  . Financial resource strain: Not on  file  . Food insecurity    Worry: Not on file    Inability: Not on file  . Transportation needs    Medical: Not on file    Non-medical: Not on file  Tobacco Use  . Smoking status: Never Smoker  . Smokeless tobacco: Never Used  Substance and Sexual Activity  . Alcohol use: No    Alcohol/week: 0.0 standard drinks  . Drug use: Yes    Types: Hydrocodone    Comment: beginning of pregnancy  . Sexual activity: Yes    Birth control/protection: None  Lifestyle  . Physical activity    Days per week: Not on file    Minutes per session: Not on file  . Stress: Not on file  Relationships  . Social Musicianconnections    Talks on phone: Not on file    Gets together: Not on file    Attends religious service: Not on file    Active member of club or organization: Not on file    Attends meetings of clubs or organizations: Not on file    Relationship status: Not on file  . Intimate partner violence    Fear of current or ex partner: Not on file    Emotionally abused: Not on file    Physically abused: Not on file    Forced sexual activity: Not on file  Other Topics Concern  . Not on file  Social History Narrative  . Not on file    Review of Systems: Gen: Denies any fever, chills, sweats, anorexia, fatigue, weakness, malaise, weight loss, and sleep disorder HEENT: No visual complaints, No history of Retinopathy. Normal external appearance No Epistaxis or Sore throat. No sinusitis.   CV: Denies chest pain, angina, palpitations, syncope, orthopnea, PND, peripheral edema, and claudication. Resp: Denies dyspnea at rest, dyspnea with exercise, cough, sputum, wheezing, coughing up blood, and pleurisy. GI: Denies vomiting blood, jaundice, and fecal incontinence.   Denies dysphagia or odynophagia. GU : Denies urinary burning, blood in urine, urinary frequency, urinary hesitancy, nocturnal urination, and urinary incontinence.  No renal calculi. MS: Denies joint pain, limitation of movement, and swelling,  stiffness, low back pain, extremity pain. Denies muscle weakness, cramps, atrophy.  No use of non steroidal antiinflammatory drugs. Derm: Denies rash, itching, dry skin, hives, moles, warts, or unhealing ulcers.  Psych: Denies depression, anxiety, memory loss, suicidal ideation, hallucinations, paranoia, and confusion. Heme: Denies bruising, bleeding, and enlarged lymph nodes. Neuro: No headache.  No diplopia. No dysarthria.  No dysphasia.  No history of CVA.  No Seizures. No paresthesias.  No weakness. Endocrine No DM.  No Thyroid disease.  No Adrenal disease.  Physical Exam: Vital signs in last 24 hours: Temp:  [97.9 F (36.6 C)-98.6 F (37 C)] 97.9 F (36.6 C) (08/25 0834) Pulse Rate:  [90-114] 90 (08/25 0834) Resp:  [17-21] 18 (  08/25 0045) BP: (104-143)/(62-98) 110/70 (08/25 0834) SpO2:  [97 %-100 %] 100 % (08/25 0834) Weight:  [72.6 kg] 72.6 kg (08/25 0045) Last BM Date: 11/01/18 General:   Alert,  Well-developed, well-nourished, pleasant and cooperative in NAD Head:  Normocephalic and atraumatic. Eyes:  Sclera clear, no icterus.   Conjunctiva pink. Ears:  Normal auditory acuity. Nose:  No deformity, discharge,  or lesions. Mouth:  No deformity or lesions, dentition normal. Neck:  Supple; no masses or thyromegaly. JVP not elevated Lungs:  Clear throughout to auscultation.   No wheezes, crackles, or rhonchi. No acute distress. Heart:  Regular rate and rhythm; no murmurs, clicks, rubs,  or gallops. Abdomen:  Soft, nontender and gravid uterus Msk:  Symmetrical without gross deformities. Normal posture. Pulses:  No carotid, renal, femoral bruits. DP and PT symmetrical and equal Extremities:  Without clubbing or edema. Neurologic:  Alert and  oriented x4;  grossly normal neurologically. Skin:  Intact without significant lesions or rashes. Cervical Nodes:  No significant cervical adenopathy. Psych:  Alert and cooperative. Normal mood and affect.  Intake/Output from previous  day: 08/24 0701 - 08/25 0700 In: 1420 [I.V.:420; IV Piggyback:1000] Out: -  Intake/Output this shift: Total I/O In: 615 [P.O.:240; I.V.:375] Out: -   Lab Results: Recent Labs    11/04/18 1436 11/05/18 0605  WBC 7.6 4.5  HGB 13.1 15.8*  HCT 39.7 47.6*  PLT 427* 250   BMET Recent Labs    11/04/18 1436 11/05/18 0521  NA 135 136  K 3.7 4.0  CL 105 109  CO2 16* 14*  GLUCOSE 91 67*  BUN 24* 25*  CREATININE 2.75* 2.72*  CALCIUM 9.1 8.7*  PHOS  --  4.0   LFT Recent Labs    11/05/18 0521  PROT 7.1  ALBUMIN 2.0*  AST 164*  ALT 148*  ALKPHOS 131*  BILITOT 6.0*   PT/INR No results for input(s): LABPROT, INR in the last 72 hours. Hepatitis Panel No results for input(s): HEPBSAG, HCVAB, HEPAIGM, HEPBIGM in the last 72 hours.  Studies/Results: Dg Chest Port 1 View  Result Date: 11/04/2018 CLINICAL DATA:  COVID-19 pneumonia.  Dyspnea.  Cough. EXAM: PORTABLE CHEST 1 VIEW COMPARISON:  10/22/2008 chest radiograph. FINDINGS: Stable cardiomediastinal silhouette with normal heart size. No pneumothorax. No pleural effusion. Faint patchy opacities in the peripheral lungs bilaterally. No pulmonary edema. IMPRESSION: Faint patchy opacities in the peripheral lungs bilaterally, compatible with atypical pneumonia. Electronically Signed   By: Delbert PhenixJason A Poff M.D.   On: 11/04/2018 16:53    Assessment/Plan:  Acute kidney injury in the setting of her third trimester pregnancy.  This appears to be volume depletion in the setting of nausea and vomiting.  She also has tested positive for COVID and has a possible COVID pneumonia although does not appear to be in any respiratory distress.  She has normal blood pressure and on 10/24/2018 and unremarkable urine except for 30 mg/dL protein.  We will recheck urinalysis urine and urine sodium.  We also ask for renal ultrasound.  I will increase IV fluids to 100 cc an hour.  Metabolic acidosis probably in the setting of renal failure she may have some  mild respiratory alkalosis.  This could be secondary to pregnancy.  This should improve with IV fluids.  Crohn's disease patient receives outpatient Remicade every 4 weeks.  COVID pneumonia continue supportive care  Third trimester pregnancy.  Close to term.  Involve OB/GYN and fetal maternal health medicine   LOS: 0 Garnetta BuddyMartin W Hellena Pridgen @  TODAY@10 :59 AM

## 2018-11-06 ENCOUNTER — Inpatient Hospital Stay (HOSPITAL_COMMUNITY): Payer: BC Managed Care – PPO

## 2018-11-06 DIAGNOSIS — R945 Abnormal results of liver function studies: Secondary | ICD-10-CM

## 2018-11-06 DIAGNOSIS — R748 Abnormal levels of other serum enzymes: Secondary | ICD-10-CM

## 2018-11-06 LAB — CBC WITH DIFFERENTIAL/PLATELET
Abs Immature Granulocytes: 0.04 10*3/uL (ref 0.00–0.07)
Basophils Absolute: 0 10*3/uL (ref 0.0–0.1)
Basophils Relative: 0 %
Eosinophils Absolute: 0.1 10*3/uL (ref 0.0–0.5)
Eosinophils Relative: 1 %
HCT: 27.9 % — ABNORMAL LOW (ref 36.0–46.0)
Hemoglobin: 9.2 g/dL — ABNORMAL LOW (ref 12.0–15.0)
Immature Granulocytes: 1 %
Lymphocytes Relative: 30 %
Lymphs Abs: 1.8 10*3/uL (ref 0.7–4.0)
MCH: 29.9 pg (ref 26.0–34.0)
MCHC: 33 g/dL (ref 30.0–36.0)
MCV: 90.6 fL (ref 80.0–100.0)
Monocytes Absolute: 1 10*3/uL (ref 0.1–1.0)
Monocytes Relative: 16 %
Neutro Abs: 3.1 10*3/uL (ref 1.7–7.7)
Neutrophils Relative %: 52 %
Platelets: 293 10*3/uL (ref 150–400)
RBC: 3.08 MIL/uL — ABNORMAL LOW (ref 3.87–5.11)
RDW: 13.5 % (ref 11.5–15.5)
WBC: 6 10*3/uL (ref 4.0–10.5)
nRBC: 0 % (ref 0.0–0.2)

## 2018-11-06 LAB — TRIGLYCERIDES: Triglycerides: 280 mg/dL — ABNORMAL HIGH (ref <150)

## 2018-11-06 LAB — COMPREHENSIVE METABOLIC PANEL
ALT: 112 U/L — ABNORMAL HIGH (ref 0–44)
AST: 112 U/L — ABNORMAL HIGH (ref 15–41)
Albumin: 1.6 g/dL — ABNORMAL LOW (ref 3.5–5.0)
Alkaline Phosphatase: 113 U/L (ref 38–126)
Anion gap: 8 (ref 5–15)
BUN: 22 mg/dL — ABNORMAL HIGH (ref 6–20)
CO2: 15 mmol/L — ABNORMAL LOW (ref 22–32)
Calcium: 8.2 mg/dL — ABNORMAL LOW (ref 8.9–10.3)
Chloride: 114 mmol/L — ABNORMAL HIGH (ref 98–111)
Creatinine, Ser: 2.55 mg/dL — ABNORMAL HIGH (ref 0.44–1.00)
GFR calc Af Amer: 27 mL/min — ABNORMAL LOW (ref >60)
GFR calc non Af Amer: 24 mL/min — ABNORMAL LOW (ref >60)
Glucose, Bld: 71 mg/dL (ref 70–99)
Potassium: 3.7 mmol/L (ref 3.5–5.1)
Sodium: 137 mmol/L (ref 135–145)
Total Bilirubin: 4.8 mg/dL — ABNORMAL HIGH (ref 0.3–1.2)
Total Protein: 5.9 g/dL — ABNORMAL LOW (ref 6.5–8.1)

## 2018-11-06 LAB — RETICULOCYTES
Immature Retic Fract: 13 % (ref 2.3–15.9)
RBC.: 3.06 MIL/uL — ABNORMAL LOW (ref 3.87–5.11)
Retic Count, Absolute: 35.8 10*3/uL (ref 19.0–186.0)
Retic Ct Pct: 1.2 % (ref 0.4–3.1)

## 2018-11-06 LAB — C-REACTIVE PROTEIN: CRP: 1.2 mg/dL — ABNORMAL HIGH (ref <1.0)

## 2018-11-06 LAB — IRON AND TIBC
Iron: 126 ug/dL (ref 28–170)
Saturation Ratios: 48 % — ABNORMAL HIGH (ref 10.4–31.8)
TIBC: 263 ug/dL (ref 250–450)
UIBC: 137 ug/dL

## 2018-11-06 LAB — D-DIMER, QUANTITATIVE: D-Dimer, Quant: 5.95 ug/mL-FEU — ABNORMAL HIGH (ref 0.00–0.50)

## 2018-11-06 LAB — MAGNESIUM: Magnesium: 2.1 mg/dL (ref 1.7–2.4)

## 2018-11-06 LAB — PHOSPHORUS: Phosphorus: 3.2 mg/dL (ref 2.5–4.6)

## 2018-11-06 LAB — INTERLEUKIN-6, PLASMA: Interleukin-6, Plasma: 7.7 pg/mL (ref 0.0–12.2)

## 2018-11-06 LAB — HEPATITIS B SURFACE ANTIGEN: Hepatitis B Surface Ag: NEGATIVE

## 2018-11-06 LAB — FERRITIN
Ferritin: 312 ng/mL — ABNORMAL HIGH (ref 11–307)
Ferritin: 357 ng/mL — ABNORMAL HIGH (ref 11–307)

## 2018-11-06 LAB — CK: Total CK: 48 U/L (ref 38–234)

## 2018-11-06 LAB — VITAMIN B12: Vitamin B-12: 359 pg/mL (ref 180–914)

## 2018-11-06 LAB — FOLATE: Folate: 16.5 ng/mL (ref >5.9)

## 2018-11-06 NOTE — Progress Notes (Signed)
[facility] ROUNDING NOTE   Subjective:   This is a very pleasant 85 lady with history of Crohn's disease.  She developed COVID August 7.  She is [redacted] weeks pregnant..  She was found to have acute kidney  failure.  This appears to be in the setting of volume depletion.  She has been evaluated by OB/GYN as well as telemetry visit from fetomaternal medicine.    Blood pressure 126/73 pulse 105 temperature 98.1.  Urine output 650 cc 11/05/2018+ fluid balance 1 L  Sodium 137 potassium 3.7 chloride 115 CO2 15 BUN 22 creatinine 2.55 glucose 71 phosphorus 3.2 magnesium 2.1 AST 112 ALT 112 albumin 1.6  Renal ultrasound showed sludge in the gallbladder but negative for acute cholecystitis. Echogenic right kidney with hydronephrosis.  This fullness is typical in pregnancy.  Ureteral jets argue against true obstruction.  Appreciate clarity from radiology Dr. Nelson Chimes  Urinalysis bland 10/24/2018.  Does have some pyuria and hematuria 11/05/2018    Objective:  Vital signs in last 24 hours:  Temp:  [98.1 F (36.7 C)-98.4 F (36.9 C)] 98.1 F (36.7 C) (08/25 2246) Pulse Rate:  [92-106] 105 (08/25 2246) Resp:  [15-18] 16 (08/25 2246) BP: (112-126)/(66-73) 126/73 (08/25 2246) SpO2:  [96 %-100 %] 100 % (08/25 2246)  Weight change:  Filed Weights   11/05/18 0045  Weight: 72.6 kg    Intake/Output: I/O last 3 completed shifts: In: 2995 [P.O.:600; I.V.:1395; IV Piggyback:1000] Out: 650 [Urine:650]   Intake/Output this shift:  Total I/O In: 1262.1 [P.O.:350; I.V.:912.1] Out: -   Nondistressed alert pleasant history from nurse.  Cope with positive patient did not enter the room.   Basic Metabolic Panel: Recent Labs  Lab 11/04/18 1436 11/05/18 0521 11/06/18 0550  NA 135 136 137  K 3.7 4.0 3.7  CL 105 109 114*  CO2 16* 14* 15*  GLUCOSE 91 67* 71  BUN 24* 25* 22*  CREATININE 2.75* 2.72* 2.55*  CALCIUM 9.1 8.7* 8.2*  MG  --  2.1 2.1  PHOS  --  4.0 3.2    Liver  Function Tests: Recent Labs  Lab 11/04/18 1436 11/05/18 0521 11/06/18 0550  AST 158* 164* 112*  ALT 132* 148* 112*  ALKPHOS 152* 131* 113  BILITOT 6.2* 6.0* 4.8*  PROT 8.4* 7.1 5.9*  ALBUMIN 2.3* 2.0* 1.6*   Recent Labs  Lab 11/05/18 0521  LIPASE 177*   No results for input(s): AMMONIA in the last 168 hours.  CBC: Recent Labs  Lab 11/04/18 1436 11/05/18 0605 11/06/18 0550  WBC 7.6 4.5 6.0  NEUTROABS 4.6 2.8 3.1  HGB 13.1 15.8* 9.2*  HCT 39.7 47.6* 27.9*  MCV 91.3 88.5 90.6  PLT 427* 250 293    Cardiac Enzymes: Recent Labs  Lab 11/05/18 0521 11/06/18 0550  CKTOTAL 55 48    BNP: Invalid input(s): POCBNP  CBG: No results for input(s): GLUCAP in the last 168 hours.  Microbiology: Results for orders placed or performed during the hospital encounter of 10/24/18  Culture, OB Urine     Status: Abnormal   Collection Time: 10/24/18 11:11 AM   Specimen: Urine, Random  Result Value Ref Range Status   Specimen Description URINE, RANDOM  Final   Special Requests NONE  Final   Culture (A)  Final    <10,000 COLONIES/mL INSIGNIFICANT GROWTH NO GROUP B STREP (S.AGALACTIAE) ISOLATED Performed at [facility], [address], [place], [place] [address]    Report Status 10/26/2018 FINAL  Final  Coagulation Studies: No results for input(s): LABPROT, INR in the last 72 hours.  Urinalysis: Recent Labs    11/05/18 1314  COLORURINE AMBER*  LABSPEC 1.013  PHURINE 5.0  GLUCOSEU NEGATIVE  HGBUR MODERATE*  BILIRUBINUR NEGATIVE  KETONESUR 20*  PROTEINUR 100*  NITRITE NEGATIVE  LEUKOCYTESUR TRACE*      Imaging: US Renal  Result Date: 11/05/2018 CLINICAL DATA:  Third trimester pregnancy with diabetes and acute renal failure. EXAM: RENAL / URINARY TRACT ULTRASOUND COMPLETE COMPARISON:  CT abdomen 06/30/2008 FINDINGS: Right Kidney: Renal measurements: 13 x 4.5 x 5.1 cm = volume: 158 mL. Increased echogenicity of the renal parenchyma. Mild fullness of the  renal collecting system, often seen in third trimester pregnancy. Left Kidney: The left kidney was not identified by the technologist. Based on previous CT, this was low lying in the inferior left retroperitoneum and may have been inapparent based on the pregnancy. Bladder: Bilateral ureteral jets are visible. IMPRESSION: Right kidney is echogenic suggesting renal parenchymal disease. Fullness of the renal collecting system as is usually seen in third trimester pregnancy. Ureteral jet argues against true obstruction. The left kidney is not visualized by the technologist. 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 Left ureteral jet suggests that there is a functioning left kidney present, but not seen by sonography. Electronically Signed   By: Paulina Fusi M.D.   On: 11/05/2018 15:29   Dg Chest Port 1 View  Result Date: 11/04/2018 CLINICAL DATA:  COVID-19 pneumonia.  Dyspnea.  Cough. EXAM: PORTABLE CHEST 1 VIEW COMPARISON:  10/22/2008 chest radiograph. FINDINGS: Stable cardiomediastinal silhouette with normal heart size. No pneumothorax. No pleural effusion. Faint patchy opacities in the peripheral lungs bilaterally. No pulmonary edema. IMPRESSION: Faint patchy opacities in the peripheral lungs bilaterally, compatible with atypical pneumonia. Electronically Signed   By: Delbert Phenix M.D.   On: 11/04/2018 16:53   US Abdomen Limited Ruq  Result Date: 11/05/2018 CLINICAL DATA:  Thirty-five weeks pregnant, elevated bilirubin EXAM: ULTRASOUND ABDOMEN LIMITED RIGHT UPPER QUADRANT COMPARISON:  None. FINDINGS: Gallbladder: Moderate sludge in the gallbladder. Normal wall thickness. Negative sonographic Murphy. Common bile duct: Diameter: 3.3 mm Liver: No focal lesion identified. Within normal limits in parenchymal echogenicity. Portal vein is patent on color Doppler imaging with normal direction of blood flow towards the liver. Other: Echogenic right kidney with  hydronephrosis IMPRESSION: 1. Sludge in the gallbladder. Negative for acute cholecystitis or biliary dilatation 2. Echogenic right kidney with hydronephrosis Electronically Signed   By: Jasmine Pang M.D.   On: 11/05/2018 22:17     Medications:   . sodium chloride 75 mL/hr at 11/05/18 1000  . sodium chloride 100 mL/hr at 11/06/18 0950   . enoxaparin (LOVENOX) injection  30 mg Subcutaneous Q24H  . multivitamin with minerals  1 tablet Oral Daily  . vitamin C  500 mg Oral Daily  . zinc sulfate  220 mg Oral Daily   chlorpheniramine-HYDROcodone, ondansetron (ZOFRAN) IV  Assessment/ Plan:   Acute kidney injury setting of third trimester pregnancy appears to be volume depleted may have an element of ATN creatinine appears to be improving with hydration.  There was fullness noted in the right collecting system.  This would be consistent with third trimester pregnancy.  Urinalysis does appear a little cloudy.  Would recommend sending urine for culture and sensitivity.  We will continue IV fluids at 100 cc an hour  Metabolic acidosis/respiratory alkalosis.  Consistent with pregnancy  Hypoalbuminemia consistent with pregnancy  Crohn's disease patient receives Remicade every 4 weeks  COVID pneumonia continue supportive care  Third trimester pregnancy close follow-up with OB/GYN and fetal maternal health medicine   LOS: 1 Garnetta BuddyMartin W Tanish Prien @TODAY @9 :52 AM

## 2018-11-06 NOTE — Progress Notes (Signed)
RROB performed NST on pt which is reactive and reassuring.  Audible fetal movement noted.  PT has no complains and says that she is feeling better.  Dr. Alwyn Pea made aware.

## 2018-11-06 NOTE — Progress Notes (Signed)
OB/ GYN Progress Note:  Patient seen at bedside  Name: Marissa Adams Medical Record Number:  [id number] Date of Birth: January 13, 1986 Date of Service: 11/06/2018  Marissa Adams is a 33 y.o. [id number] at  3051w1d HD#2 admitted for presumed COVID 19 pneumonia, Acute renal failure, Transaminitis and hyperbilirubinemia  S: Pt currently stable with no complaints. Patient notes that she has "mild cough".  No shortness of breath.       She denies contractions, no vaginal bleeding, no leaking of fluid. Reports good FM.  She notes                              improvement in voiding volume.   : Patient Active Problem List   Diagnosis Date Noted  . AKI (acute kidney injury) (HCC) 11/04/2018  . Pneumonia due to COVID-19 virus 11/04/2018  . Transaminitis & hyperbilirubinemia 11/04/2018  . Pregnancy and infectious disease in third trimester 11/04/2018  . Sickle cell trait (HCC) 04/25/2016  . Crohn disease (HCC) 12/08/2011     O: Physical Examination:   Vitals:   11/06/18 0815 11/06/18 1620  BP: 117/77 115/67  Pulse:    Resp: 16 (!) 22  Temp: 98.2 F (36.8 C) 98.3 F (36.8 C)  SpO2: 100% 99%   General appearance - alert, well appearing, and in no distress, oriented to person, place, and time and acyanotic, in no respiratory distress Mental status - alert, oriented to person, place, and time Ex SCDs FHTs  Baseline 145, moderate variability accels no decels Toco  none  Cervix: not evaluated  Results for orders placed or performed during the hospital encounter of 11/04/18 (from the past 24 hour(s))  CBC with Differential/Platelet     Status: Abnormal   Collection Time: 11/06/18  5:50 AM  Result Value Ref Range   WBC 6.0 4.0 - 10.5 K/uL   RBC 3.08 (L) 3.87 - 5.11 MIL/uL   Hemoglobin 9.2 (L) 12.0 - 15.0 g/dL   HCT 13.027.9 (L) 86.536.0 - 78.446.0 %   MCV 90.6 80.0 - 100.0 fL   MCH 29.9 26.0 - 34.0 pg   MCHC 33.0 30.0 - 36.0 g/dL   RDW 69.613.5 29.511.5 - 28.415.5 %   Platelets 293 150 - 400 K/uL   nRBC 0.0 0.0 -  0.2 %   Neutrophils Relative % 52 %   Neutro Abs 3.1 1.7 - 7.7 K/uL   Lymphocytes Relative 30 %   Lymphs Abs 1.8 0.7 - 4.0 K/uL   Monocytes Relative 16 %   Monocytes Absolute 1.0 0.1 - 1.0 K/uL   Eosinophils Relative 1 %   Eosinophils Absolute 0.1 0.0 - 0.5 K/uL   Basophils Relative 0 %   Basophils Absolute 0.0 0.0 - 0.1 K/uL   Immature Granulocytes 1 %   Abs Immature Granulocytes 0.04 0.00 - 0.07 K/uL  Comprehensive metabolic panel     Status: Abnormal   Collection Time: 11/06/18  5:50 AM  Result Value Ref Range   Sodium 137 135 - 145 mmol/L   Potassium 3.7 3.5 - 5.1 mmol/L   Chloride 114 (H) 98 - 111 mmol/L   CO2 15 (L) 22 - 32 mmol/L   Glucose, Bld 71 70 - 99 mg/dL   BUN 22 (H) 6 - 20 mg/dL   Creatinine, Ser 1.322.55 (H) 0.44 - 1.00 mg/dL   Calcium 8.2 (L) 8.9 - 10.3 mg/dL   Total Protein 5.9 (L) 6.5 - 8.1 g/dL  Albumin 1.6 (L) 3.5 - 5.0 g/dL   AST 112 (H) 15 - 41 U/L   ALT 112 (H) 0 - 44 U/L   Alkaline Phosphatase 113 38 - 126 U/L   Total Bilirubin 4.8 (H) 0.3 - 1.2 mg/dL   GFR calc non Af Amer 24 (L) >60 mL/min   GFR calc Af Amer 27 (L) >60 mL/min   Anion gap 8 5 - 15  C-reactive protein     Status: Abnormal   Collection Time: 11/06/18  5:50 AM  Result Value Ref Range   CRP 1.2 (H) <1.0 mg/dL  CK     Status: None   Collection Time: 11/06/18  5:50 AM  Result Value Ref Range   Total CK 48 38 - 234 U/L  D-dimer, quantitative (not at [facility])     Status: Abnormal   Collection Time: 11/06/18  5:50 AM  Result Value Ref Range   D-Dimer, Quant 5.95 (H) 0.00 - 0.50 ug/mL-FEU  Ferritin     Status: Abnormal   Collection Time: 11/06/18  5:50 AM  Result Value Ref Range   Ferritin 312 (H) 11 - 307 ng/mL  Magnesium     Status: None   Collection Time: 11/06/18  5:50 AM  Result Value Ref Range   Magnesium 2.1 1.7 - 2.4 mg/dL  Phosphorus     Status: None   Collection Time: 11/06/18  5:50 AM  Result Value Ref Range   Phosphorus 3.2 2.5 - 4.6 mg/dL  Triglycerides     Status:  Abnormal   Collection Time: 11/06/18  5:50 AM  Result Value Ref Range   Triglycerides 280 (H) <150 mg/dL  Vitamin B12     Status: None   Collection Time: 11/06/18  9:21 AM  Result Value Ref Range   Vitamin B-12 359 180 - 914 pg/mL  Folate     Status: None   Collection Time: 11/06/18  9:21 AM  Result Value Ref Range   Folate 16.5 >5.9 ng/mL  Iron and TIBC     Status: Abnormal   Collection Time: 11/06/18  9:21 AM  Result Value Ref Range   Iron 126 28 - 170 ug/dL   TIBC 263 250 - 450 ug/dL   Saturation Ratios 48 (H) 10.4 - 31.8 %   UIBC 137 ug/dL  Ferritin     Status: Abnormal   Collection Time: 11/06/18  9:21 AM  Result Value Ref Range   Ferritin 357 (H) 11 - 307 ng/mL  Reticulocytes     Status: Abnormal   Collection Time: 11/06/18  9:21 AM  Result Value Ref Range   Retic Ct Pct 1.2 0.4 - 3.1 %   RBC. 3.06 (L) 3.87 - 5.11 MIL/uL   Retic Count, Absolute 35.8 19.0 - 186.0 K/uL   Immature Retic Fract 13.0 2.3 - 15.9 %    Assessment & Plan:  33 year old G4P2 at 34 weeks 1 day admitted under IM (appreciate care) for AKI, and COVID 19 pneumonia.  Currently stable from an obstetrical standpoint.   AKI Patient's baseline creatinine since being pregnant is 0.7. creatinine of 2.7 on admission and presently 2.5. In setting of persistent vomiting as an outpatient.  -Continuing IV hydration -Nephrology Consultation done today, Dr. Justin Mend Recommends urine culture and sensitivities.    Renal injury most likely due to volume depletion.   -Strict ins/outs  Pregnancy, third trimester - No indication for delivery or corticosteroids for fetal lung maturity per MFM. - Will continue NSTs daily from  OB Rapid Response Nurse - GBS next week - Per MFM BPPs or ultrasound not needed at this time, last US fetus appropriately growing.  - Ultimate goal is NSVD for patient and only c-section for obstetric or maternal deterioration  Transaminitis / Hyperbilirubinemia -RUQ abdominal ultrasound shows  sludge in gallbladder, negative for acute cholecystitis or   biliary dilatation.  Unlikely due to Preeclampsia due to normotensive blood pressures.   Elevated d-dimer In setting of COVID-19 infection in addition to pregnancy.  No concern for PE at this time.  COVID-19 infection COVID + August 8th Patient with mild cough, otherwise asymptomatic. CXR today: hazy opacities in periphery of both lungs slightly great on right. Afebrile / no hypoxia.  Novel Coronavirus test pending. Suppurative care  If repeat Covid 19 test is negative, per ID: continue isolation and precautions d/t patient's sx   DVT prophylaxis: SCDs - no lovenox at this time d/t elevated d-dimer  Code Status:   Full   Family Communication: None  [facility] Plan[facility] AKI resolved   Abiola Behring STACIA

## 2018-11-06 NOTE — Progress Notes (Signed)
PROGRESS NOTE    Marissa Adams  INO:[id number] DOB: 09/19/1985 DOA: 11/04/2018 PCP: Patient, No Pcp Per  Brief Narrative:  Patient is a 33 year old overweight African-American female with a past medical history significant for Crohn's disease on Remicade every 4 weeks, as well as history of sickle cell trait, and other comorbidities is [redacted] weeks pregnant who presented with nausea vomiting as well as decreased fetal movement and contractions.  Patient states that her symptoms developed on 7 August with nausea vomiting and she tested positive for COVID done as her husband initially tested positive.  She developed some initial coughing with some nausea vomiting and body aches with loss of taste and smell but no associated chest pain or shortness of breath.  She has a history of diabetes mellitus in 2014.  She was admitted for an acute kidney injury as well as metabolic acidosis.  There is also concern for pneumonia due to COVID-19 virus.  Incidentally her transaminases were elevated and have been trending down.  OB/GYN and nephrology were consulted and are following.  Assessment & Plan:   Active Problems:   Crohn disease ([place])   AKI (acute kidney injury) ([place])   Pneumonia due to COVID-19 virus   Transaminitis   Pregnancy and infectious disease in third trimester  Acute Kidney Injury -In the setting of dehydration as baseline creatinine 0.7 -Continue IV fluid resuscitation with normal saline rate of 100 mL's per hour -Nephrology is recommending obtaining a urinalysis and urine culture -Nephrology feels she may have an element of ATN as her creatinine is improving with hydration -Renal ultrasound showed a fullness in the right renal collecting system and is consistent with a third trimester pregnancy. -BUN/creatinine is improving was now 22/2.55; on admission was 24/2.75 -Avoid nephrotoxic medications, contrast dyes as well as hypotension if possible -Renal U/S showed "Right kidney is  echogenic suggesting renal parenchymal disease. Fullness of the renal collecting system as is usually seen in third trimester pregnancy. Ureteral jet argues against true obstruction. The left kidney is not visualized by the technologist. 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 Left ureteral jet suggests that there is a functioning left kidney present, but not seen by sonography." -Hepatitis serologies negative so far -Repeat urinalysis showed the appearance with amber color urine, moderate hemoglobin, 20 ketones, trace leukocytes, negative nitrites, rare bacteria, 0-5 non-squamous epithelial cells, 21-50 squamous epithelial cells, 6-10 WBCs -Urine culture still pending -Urine protein creatinine ratio was 0.83, urine sodium was 41, and urine creatinine was 146.28 -Continue monitor and trend renal function -Repeat CMP in a.m.  Abnormal LFTs/elevated transaminases Hyperbilirubinemia associated with scleral icterus and jaundice -Likely in the setting of COVID disease and possibly other etiology such as pancreatitis and trending down -Patient's AST peaked to 164 and ALT peaked to 148 and both of trended down to 112 -T bili was 6.2 is now trending down to 4.8 -Right upper quadrant ultrasound showed "Sludge in the gallbladder. Negative for acute cholecystitis or biliary dilatation. Echogenic right kidney with hydronephrosis -OB/GYN does not feel that this is secondary to preeclampsia due to normotensive blood pressures -Lipase was elevated at 177 and will trend and monitor and repeat in the a.m. -Continue to monitor trend -Repeat CMP in a.m.  Normocytic Anemia -Patient's hemoglobin/hematocrit had a dramatic drop from 15.8/47.6 and went to 9.2/27.9 -Likely in the setting of dilutional drop -Check anemia panel on iron level showed 126, U IBC is 137, TIBC was 263, saturation ratios were  48%, ferritin level was 357, folate level was 16.5, vitamin B12 was  35.9 -Check FOBT -Continue to monitor for signs and symptoms of bleeding; currently no overt bleeding noted -May need some IV iron and will also defer further anemia work-up to OB/GYN -Repeat CBC in a.m.  Metabolic Acidosis with Compensated Respiratory Alkalosis -Likely in the setting of pregnancy -Continue with IV fluids per Nephrology at 100 mL's per hour -Patient's CO2 is now 15, chloride levels 114, and anion gap is 8 -Continue monitor trend and may need to change IV fluid hydration from normal saline to D5  COVID-19 Infection with associated viral pneumonia Elevated D-dimer -Patient had a mild cough otherwise she was asymptomatic; there were infiltrates seen on chest x-ray but she is afebrile and has no hypoxia -CXR today showed "Hazy opacities in the periphery of both lungs slightly greater on RIGHT question atypical pneumonia, minimally more prominent on LEFT than on prior study." -We will repeat COVID screening as she tested positive on August 8; even if her COVID repeat test was negative she is still considered infectious needs to maintain isolation given her mild symptoms -CRP is trending downward from 3.4 and is now 1.2 -Triglycerides is also trending down from 345-280 -CK total was 48 -LDH is trending down as well over from 334 is now 280 -Procalcitonin level of 0.75 and 0.77 -Lactic acid level was normal and was 1.1 -ESR was 124 and will repeat in a.m. -D-dimer is relatively stable and went from 5.28 - > 5.20 and is now 5.59 -Fibrinogen is trending down from greater than 800 and is now 707 -Continue supportive care and continue to monitor inflammatory markers -Continue Tussionex as needed  Pregnancy in the third trimester -Patient is [redacted] weeks pregnant next-she is evaluated by OB/GYN as well as maternal-fetal medicine over the telephone -OB-GYN and MFM feel there is no indication for delivery or corticosteroids for fetal lung maturity per MFM -They are recommending  continuing NSTs daily from OB rapid response note -They will be to be checking a GBS next week -Per maternal-fetal medicine BPP's or ultrasound not needed at this time as last ultrasound fetus showing is appropriate going next-ultimate goal is for a NSVD for patient and only C-section for obstetric or maternal deterioration  Crohn's Disease -Currently on Remicade every 4 weeks and this is being held -States that she has 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  DVT prophylaxis: SCDs as OB/GYN does not recommend Lovenox with elevated D-dimers Code Status: FULL CODE Family Communication: No family present at bedside  Disposition Plan: Pending further improvement in renal function and inflammatory markers.  Consultants:   Nephrology   Maternal Fetal Medicine  OB-GYN   Procedures: NST  Antimicrobials:  Anti-infectives (From admission, onward)   None     Subjective: Seen and examined at bedside states that she is feeling a bit nauseous this morning.  No chest pain, lightheadedness or dizziness.  States that she has a mild cough but feels okay.  Denies any shortness of breath.  No other concerns or complaints at this time.  Objective: Vitals:   11/05/18 2107 11/05/18 2246 11/06/18 0815 11/06/18 1620  BP: 113/70 126/73 117/77 115/67  Pulse: 92 (!) 105    Resp: _0 (!) 22  Temp: 98.4 F (36.9 C) 98.1 F (36.7 C) 98.2 F (36.8 C) 98.3 F (36.8 C)  TempSrc: Oral Oral Oral Oral  SpO2: 96% 100% 100% 99%  Weight:      Height:  Intake/Output Summary (Last 24 hours) at 11/06/2018 1758 Last data filed at 11/06/2018 3267 Gross per 24 hour  Intake 2222.08 ml  Output 500 ml  Net 1722.08 ml   Filed Weights   11/05/18 0045  Weight: 72.6 kg   Examination: Physical Exam:  Constitutional: WN/WD overweight African-American female in NAD and appears calm and comfortable Eyes: Lids and conjunctivae normal, sclerae icteric  ENMT: External Ears, Nose appear normal.  Grossly normal hearing. Mucous membranes are moist.  Neck: Appears normal, supple, no cervical masses, normal ROM, no appreciable thyromegaly; no JVD Respiratory: Diminished to auscultation bilaterally, no wheezing, rales, rhonchi or crackles. Normal respiratory effort and patient is not tachypenic. No accessory muscle use.  Cardiovascular: RRR, no murmurs / rubs / gallops. S1 and S2 auscultated. No extremity edema. 2+ pedal pulses. No carotid bruits.  Abdomen: Soft, non-tender, non-distended. No masses palpated. No appreciable hepatosplenomegaly. Bowel sounds positive x4.  GU: Deferred. Musculoskeletal: No clubbing / cyanosis of digits/nails. No joint deformity upper and lower extremities.  Skin: No rashes, lesions, ulcers or lesions. No induration; Warm and dry.  Neurologic: CN 2-12 grossly intact with no focal deficits. Romberg sign and cerebellar reflexes not assessed.  Psychiatric: Normal judgment and insight. Alert and oriented x 3. Normal mood and appropriate affect.   Data Reviewed: I have personally reviewed following labs and imaging studies  CBC: Recent Labs  Lab 11/04/18 1436 11/05/18 0605 11/06/18 0550  WBC 7.6 4.5 6.0  NEUTROABS 4.6 2.8 3.1  HGB 13.1 15.8* 9.2*  HCT 39.7 47.6* 27.9*  MCV 91.3 88.5 90.6  PLT 427* 250 124   Basic Metabolic Panel: Recent Labs  Lab 11/04/18 1436 11/05/18 0521 11/06/18 0550  NA 135 136 137  K 3.7 4.0 3.7  CL 105 109 114*  CO2 16* 14* 15*  GLUCOSE 91 67* 71  BUN 24* 25* 22*  CREATININE 2.75* 2.72* 2.55*  CALCIUM 9.1 8.7* 8.2*  MG  --  2.1 2.1  PHOS  --  4.0 3.2   GFR: Estimated Creatinine Clearance: 30.4 mL/min (A) (by C-G formula based on SCr of 2.55 mg/dL (H)). Liver Function Tests: Recent Labs  Lab 11/04/18 1436 11/05/18 0521 11/06/18 0550  AST 158* 164* 112*  ALT 132* 148* 112*  ALKPHOS 152* 131* 113  BILITOT 6.2* 6.0* 4.8*  PROT 8.4* 7.1 5.9*  ALBUMIN 2.3* 2.0* 1.6*   Recent Labs  Lab 11/05/18 0521  LIPASE  177*   No results for input(s): AMMONIA in the last 168 hours. Coagulation Profile: No results for input(s): INR, PROTIME in the last 168 hours. Cardiac Enzymes: Recent Labs  Lab 11/05/18 0521 11/06/18 0550  CKTOTAL 55 48   BNP (last 3 results) No results for input(s): PROBNP in the last 8760 hours. HbA1C: No results for input(s): HGBA1C in the last 72 hours. CBG: No results for input(s): GLUCAP in the last 168 hours. Lipid Profile: Recent Labs    11/05/18 0605 11/06/18 0550  TRIG 299* 280*   Thyroid Function Tests: No results for input(s): TSH, T4TOTAL, FREET4, T3FREE, THYROIDAB in the last 72 hours. Anemia Panel: Recent Labs    11/06/18 0550 11/06/18 0921  VITAMINB12  --  359  FOLATE  --  16.5  FERRITIN 312* 357*  TIBC  --  263  IRON  --  126  RETICCTPCT  --  1.2   Sepsis Labs: Recent Labs  Lab 11/04/18 1748 11/05/18 0521 11/05/18 0605  PROCALCITON 0.75 0.77  --   LATICACIDVEN 1.8  --  1.1    Recent Results (from the past 240 hour(s))  Culture, blood (Routine X 2) w Reflex to ID Panel     Status: None (Preliminary result)   Collection Time: 11/05/18  5:21 AM   Specimen: BLOOD LEFT ARM  Result Value Ref Range Status   Specimen Description BLOOD LEFT ARM  Final   Special Requests   Final    BOTTLES DRAWN AEROBIC ONLY Blood Culture adequate volume   Culture   Final    NO GROWTH 1 DAY Performed at [facility], [address] [place], [place] [address]    Report Status PENDING  Incomplete  Culture, blood (Routine X 2) w Reflex to ID Panel     Status: None (Preliminary result)   Collection Time: 11/05/18  6:05 AM   Specimen: BLOOD LEFT ARM  Result Value Ref Range Status   Specimen Description BLOOD LEFT ARM  Final   Special Requests   Final    BOTTLES DRAWN AEROBIC ONLY Blood Culture adequate volume   Culture   Final    NO GROWTH 1 DAY Performed at [facility], [place] [address], [place], [place] [address]    Report Status PENDING   Incomplete    Radiology Studies: US Renal  Result Date: 11/05/2018 CLINICAL DATA:  Third trimester pregnancy with diabetes and acute renal failure. EXAM: RENAL / URINARY TRACT ULTRASOUND COMPLETE COMPARISON:  CT abdomen 06/30/2008 FINDINGS: Right Kidney: Renal measurements: 13 x 4.5 x 5.1 cm = volume: 158 mL. Increased echogenicity of the renal parenchyma. Mild fullness of the renal collecting system, often seen in third trimester pregnancy. Left Kidney: The left kidney was not identified by the technologist. Based on previous CT, this was low lying in the inferior left retroperitoneum and may have been inapparent based on the pregnancy. Bladder: Bilateral ureteral jets are visible. IMPRESSION: Right kidney is echogenic suggesting renal parenchymal disease. Fullness of the renal collecting system as is usually seen in third trimester pregnancy. Ureteral jet argues against true obstruction. The left kidney is not visualized by the technologist. 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 Left ureteral jet suggests that there is a functioning left kidney present, but not seen by sonography. Electronically Signed   By: Nelson Chimes M.D.   On: 11/05/2018 15:29   Dg Chest Port 1 View  Result Date: 11/06/2018 CLINICAL DATA:  Onset of shortness of breath 2 day, history diabetes mellitus, Crohn's disease EXAM: PORTABLE CHEST 1 VIEW COMPARISON:  Portable exam 0933 hours compared to 11/04/2018 FINDINGS: Normal heart size, mediastinal contours, and pulmonary vascularity. Hazy opacities in the periphery of both lungs slightly greater on RIGHT, question atypical infiltrate. Remaining lungs clear. No pleural effusion or pneumothorax. Osseous structures unremarkable. IMPRESSION: Hazy opacities in the periphery of both lungs slightly greater on RIGHT question atypical pneumonia, minimally more prominent on LEFT than on prior study. Electronically Signed   By: Lavonia Dana M.D.    On: 11/06/2018 10:55   US Abdomen Limited Ruq  Result Date: 11/05/2018 CLINICAL DATA:  Thirty-five weeks pregnant, elevated bilirubin EXAM: ULTRASOUND ABDOMEN LIMITED RIGHT UPPER QUADRANT COMPARISON:  None. FINDINGS: Gallbladder: Moderate sludge in the gallbladder. Normal wall thickness. Negative sonographic Murphy. Common bile duct: Diameter: 3.3 mm Liver: No focal lesion identified. Within normal limits in parenchymal echogenicity. Portal vein is patent on color Doppler imaging with normal direction of blood flow towards the liver. Other: Echogenic right kidney with hydronephrosis IMPRESSION:  1. Sludge in the gallbladder. Negative for acute cholecystitis or biliary dilatation 2. Echogenic right kidney with hydronephrosis Electronically Signed   By: Donavan Foil M.D.   On: 11/05/2018 22:17   Scheduled Meds:  enoxaparin (LOVENOX) injection  30 mg Subcutaneous Q24H   multivitamin with minerals  1 tablet Oral Daily   vitamin C  500 mg Oral Daily   zinc sulfate  220 mg Oral Daily   Continuous Infusions:  sodium chloride 75 mL/hr at 11/05/18 1000   sodium chloride 100 mL/hr at 11/06/18 0950    LOS: 1 day   Kerney Elbe, DO [facility] PAGER is on [place]  If 7PM-7AM, please contact night-coverage [email or web address] Password [facility] 11/06/2018, 5:58 PM

## 2018-11-07 DIAGNOSIS — D649 Anemia, unspecified: Secondary | ICD-10-CM

## 2018-11-07 LAB — CBC WITH DIFFERENTIAL/PLATELET
Abs Immature Granulocytes: 0.04 10*3/uL (ref 0.00–0.07)
Basophils Absolute: 0 10*3/uL (ref 0.0–0.1)
Basophils Relative: 0 %
Eosinophils Absolute: 0.1 10*3/uL (ref 0.0–0.5)
Eosinophils Relative: 1 %
HCT: 27.2 % — ABNORMAL LOW (ref 36.0–46.0)
Hemoglobin: 9.1 g/dL — ABNORMAL LOW (ref 12.0–15.0)
Immature Granulocytes: 1 %
Lymphocytes Relative: 29 %
Lymphs Abs: 1.6 10*3/uL (ref 0.7–4.0)
MCH: 30 pg (ref 26.0–34.0)
MCHC: 33.5 g/dL (ref 30.0–36.0)
MCV: 89.8 fL (ref 80.0–100.0)
Monocytes Absolute: 0.9 10*3/uL (ref 0.1–1.0)
Monocytes Relative: 17 %
Neutro Abs: 2.9 10*3/uL (ref 1.7–7.7)
Neutrophils Relative %: 52 %
Platelets: 289 10*3/uL (ref 150–400)
RBC: 3.03 MIL/uL — ABNORMAL LOW (ref 3.87–5.11)
RDW: 13.4 % (ref 11.5–15.5)
WBC: 5.5 10*3/uL (ref 4.0–10.5)
nRBC: 0 % (ref 0.0–0.2)

## 2018-11-07 LAB — COMPREHENSIVE METABOLIC PANEL
ALT: 103 U/L — ABNORMAL HIGH (ref 0–44)
AST: 90 U/L — ABNORMAL HIGH (ref 15–41)
Albumin: 1.5 g/dL — ABNORMAL LOW (ref 3.5–5.0)
Alkaline Phosphatase: 106 U/L (ref 38–126)
Anion gap: 7 (ref 5–15)
BUN: 18 mg/dL (ref 6–20)
CO2: 15 mmol/L — ABNORMAL LOW (ref 22–32)
Calcium: 8.1 mg/dL — ABNORMAL LOW (ref 8.9–10.3)
Chloride: 116 mmol/L — ABNORMAL HIGH (ref 98–111)
Creatinine, Ser: 2.06 mg/dL — ABNORMAL HIGH (ref 0.44–1.00)
GFR calc Af Amer: 36 mL/min — ABNORMAL LOW (ref >60)
GFR calc non Af Amer: 31 mL/min — ABNORMAL LOW (ref >60)
Glucose, Bld: 75 mg/dL (ref 70–99)
Potassium: 3.7 mmol/L (ref 3.5–5.1)
Sodium: 138 mmol/L (ref 135–145)
Total Bilirubin: 3.9 mg/dL — ABNORMAL HIGH (ref 0.3–1.2)
Total Protein: 5.6 g/dL — ABNORMAL LOW (ref 6.5–8.1)

## 2018-11-07 LAB — SEDIMENTATION RATE: Sed Rate: 99 mm/hr — ABNORMAL HIGH (ref 0–22)

## 2018-11-07 LAB — D-DIMER, QUANTITATIVE: D-Dimer, Quant: 5.5 ug/mL-FEU — ABNORMAL HIGH (ref 0.00–0.50)

## 2018-11-07 LAB — URINE CULTURE

## 2018-11-07 LAB — INTERLEUKIN-6, PLASMA: Interleukin-6, Plasma: 8.7 pg/mL (ref 0.0–12.2)

## 2018-11-07 LAB — NOVEL CORONAVIRUS, NAA (HOSP ORDER, SEND-OUT TO REF LAB; TAT 18-24 HRS): SARS-CoV-2, NAA: NOT DETECTED

## 2018-11-07 LAB — FERRITIN: Ferritin: 294 ng/mL (ref 11–307)

## 2018-11-07 LAB — C-REACTIVE PROTEIN: CRP: 0.9 mg/dL (ref <1.0)

## 2018-11-07 LAB — MAGNESIUM: Magnesium: 1.7 mg/dL (ref 1.7–2.4)

## 2018-11-07 LAB — PHOSPHORUS: Phosphorus: 2.6 mg/dL (ref 2.5–4.6)

## 2018-11-07 LAB — CK: Total CK: 46 U/L (ref 38–234)

## 2018-11-07 LAB — LIPASE, BLOOD: Lipase: 143 U/L — ABNORMAL HIGH (ref 11–51)

## 2018-11-07 LAB — TRIGLYCERIDES: Triglycerides: 215 mg/dL — ABNORMAL HIGH (ref <150)

## 2018-11-07 MED ORDER — SODIUM CHLORIDE 0.9% FLUSH
3.0000 mL | Freq: Two times a day (BID) | INTRAVENOUS | Status: DC
  Administered 2018-11-08: 3 mL via INTRAVENOUS

## 2018-11-07 MED ORDER — ONDANSETRON HCL 4 MG PO TABS: 4.0000 mg | ORAL_TABLET | Freq: Four times a day (QID) | ORAL | Status: DC | PRN

## 2018-11-07 MED ORDER — ONDANSETRON HCL 4 MG/2ML IJ SOLN
4.0000 mg | Freq: Four times a day (QID) | INTRAMUSCULAR | Status: DC | PRN
  Administered 2018-11-08: 4 mg via INTRAVENOUS

## 2018-11-07 MED ORDER — HYDROCODONE-ACETAMINOPHEN 5-325 MG PO TABS: 1.0000 | ORAL_TABLET | ORAL | Status: DC | PRN

## 2018-11-07 MED ORDER — ACETAMINOPHEN 325 MG PO TABS: 650.0000 mg | ORAL_TABLET | Freq: Four times a day (QID) | ORAL | Status: DC | PRN

## 2018-11-07 NOTE — Progress Notes (Signed)
Bedside shift report completed at bedside with [facility] RN

## 2018-11-07 NOTE — Progress Notes (Signed)
OB/ GYN Progress Note:  Telehealth visit - audio only   Ms. Encalada is a 33 y.o. [id number] at  [redacted]w[redacted]d HD#3 admitted for  COVID 19 pneumonia, Acute renal failure, Transaminitis and hyperbilirubinemia  S: Patient reports feeling better everyday. Cough is almost gone as well as SOB. She had 1 episode of vomiting this am but was able to keep her food/fluids down since. She denies contractions, no vaginal bleeding, no leaking of fluid. Reports good FM.  She notes improvement in voiding volume. She reports feeling gassy but had a normal BM yesterday.  : Patient Active Problem List   Diagnosis Date Noted  . AKI (acute kidney injury) ([place]) 11/04/2018  . Pneumonia due to COVID-19 virus 11/04/2018  . Transaminitis & hyperbilirubinemia 11/04/2018  . Pregnancy and infectious disease in third trimester 11/04/2018  . Sickle cell trait ([place]) 04/25/2016  . Crohn disease ([place]) 12/08/2011     O:    Vitals:   11/06/18 1620 11/06/18 2333  BP: 115/67 (!) 101/59  Pulse:  89  Resp: (!) 22   Temp: 98.3 F (36.8 C) 98.2 F (36.8 C)  SpO2: 99% 98%     Results for orders placed or performed during the hospital encounter of 11/04/18 (from the past 24 hour(s))  CBC with Differential/Platelet     Status: Abnormal   Collection Time: 11/07/18  5:07 AM  Result Value Ref Range   WBC 5.5 4.0 - 10.5 K/uL   RBC 3.03 (L) 3.87 - 5.11 MIL/uL   Hemoglobin 9.1 (L) 12.0 - 15.0 g/dL   HCT 27.2 (L) 36.0 - 46.0 %   MCV 89.8 80.0 - 100.0 fL   MCH 30.0 26.0 - 34.0 pg   MCHC 33.5 30.0 - 36.0 g/dL   RDW 13.4 11.5 - 15.5 %   Platelets 289 150 - 400 K/uL   nRBC 0.0 0.0 - 0.2 %   Neutrophils Relative % 52 %   Neutro Abs 2.9 1.7 - 7.7 K/uL   Lymphocytes Relative 29 %   Lymphs Abs 1.6 0.7 - 4.0 K/uL   Monocytes Relative 17 %   Monocytes Absolute 0.9 0.1 - 1.0 K/uL   Eosinophils Relative 1 %   Eosinophils Absolute 0.1 0.0 - 0.5 K/uL   Basophils Relative 0 %   Basophils Absolute 0.0 0.0 - 0.1 K/uL   Immature Granulocytes  1 %   Abs Immature Granulocytes 0.04 0.00 - 0.07 K/uL  Comprehensive metabolic panel     Status: Abnormal   Collection Time: 11/07/18  5:07 AM  Result Value Ref Range   Sodium 138 135 - 145 mmol/L   Potassium 3.7 3.5 - 5.1 mmol/L   Chloride 116 (H) 98 - 111 mmol/L   CO2 15 (L) 22 - 32 mmol/L   Glucose, Bld 75 70 - 99 mg/dL   BUN 18 6 - 20 mg/dL   Creatinine, Ser 2.06 (H) 0.44 - 1.00 mg/dL   Calcium 8.1 (L) 8.9 - 10.3 mg/dL   Total Protein 5.6 (L) 6.5 - 8.1 g/dL   Albumin 1.5 (L) 3.5 - 5.0 g/dL   AST 90 (H) 15 - 41 U/L   ALT 103 (H) 0 - 44 U/L   Alkaline Phosphatase 106 38 - 126 U/L   Total Bilirubin 3.9 (H) 0.3 - 1.2 mg/dL   GFR calc non Af Amer 31 (L) >60 mL/min   GFR calc Af Amer 36 (L) >60 mL/min   Anion gap 7 5 - 15  C-reactive protein  Status: None   Collection Time: 11/07/18  5:07 AM  Result Value Ref Range   CRP 0.9 <1.0 mg/dL  CK     Status: None   Collection Time: 11/07/18  5:07 AM  Result Value Ref Range   Total CK 46 38 - 234 U/L  D-dimer, quantitative (not at [facility])     Status: Abnormal   Collection Time: 11/07/18  5:07 AM  Result Value Ref Range   D-Dimer, Quant 5.50 (H) 0.00 - 0.50 ug/mL-FEU  Ferritin     Status: None   Collection Time: 11/07/18  5:07 AM  Result Value Ref Range   Ferritin 294 11 - 307 ng/mL  Magnesium     Status: None   Collection Time: 11/07/18  5:07 AM  Result Value Ref Range   Magnesium 1.7 1.7 - 2.4 mg/dL  Phosphorus     Status: None   Collection Time: 11/07/18  5:07 AM  Result Value Ref Range   Phosphorus 2.6 2.5 - 4.6 mg/dL  Triglycerides     Status: Abnormal   Collection Time: 11/07/18  5:07 AM  Result Value Ref Range   Triglycerides 215 (H) <150 mg/dL  Sedimentation rate     Status: Abnormal   Collection Time: 11/07/18  5:07 AM  Result Value Ref Range   Sed Rate 99 (H) 0 - 22 mm/hr  Lipase, blood     Status: Abnormal   Collection Time: 11/07/18  5:07 AM  Result Value Ref Range   Lipase 143 (H) 11 - 51 U/L     Assessment & Plan:  33 year old G4P2 at 35 weeks 1 day admitted under IM (appreciate care) for AKI, and COVID 19 pneumonia.  Currently stable from an obstetrical standpoint.   AKI  In setting of persistent vomiting and volume depletion  Continues to improve daily with Creatinine 2.02 today -Continuing IV hydration -Nephrology continues to follow.   -Strict ins/outs: urine output 1000 cc in 7 hours today  Transaminitis / Hyperbilirubinemia -RUQ abdominal ultrasound shows sludge in gallbladder, negative for acute cholecystitis or   biliary dilatation.  Unlikely due to Preeclampsia due to normotensive blood pressures. LFTs and bilirubin improving daily: AST 90, ALT 103, bilirubin 3.9  COVID-19 infection COVID + August 6th: repeated today and negative. Per ID: continue isolation and precautions d/t patient's sx  Pneumonia: cough also improving  Anemia of pregnancy Hgb stable at 9.1 with volume correction Continue daily PNV May add iron supplement at home  Pregnancy, third trimester - No indication for delivery or corticosteroids for fetal lung maturity per MFM. - Will continue NSTs daily from OB Rapid Response Nurse - GBS next week - Per MFM BPPs or ultrasound not needed at this time, last [place] fetus appropriately growing.  - Ultimate goal is NSVD for patient and only c-section for obstetric or maternal deterioration    DVT prophylaxis: SCDs - no lovenox at this time d/t elevated d-dimer  Disposition Plan:   Reviewed all findings with patient and questions answered Awaiting Nephrology and Internal Medicine plan of care: possible outpatient management? OB Rapid Response Nurse to perform daily NST and report Will continue to consult/follow daily and will schedule in office follow-up with ultrasound/BPP once released from the hospital   Dois Davenport A Navaeh Kehres  [phone]

## 2018-11-07 NOTE — Progress Notes (Signed)
[facility] ROUNDING NOTE   Subjective:   This is a very pleasant [age] lady with history of Crohn's disease.  She developed COVID August 7.  She is [redacted] weeks pregnant..  She was found to have acute kidney  failure.  This appears to be in the setting of volume depletion.  She has been evaluated by OB/GYN as well as telemetry visit from fetomaternal medicine.    Blood pressure 101/59 pulse 98 temperature 98.2 O2 sats 98% room air  Sodium 138 potassium 3.7 chloride 116 CO2 15 glucose 75 BUN 18 creatinine 2.06 calcium 8.1 phosphorus 2.6 magnesium 1.7 AST 90 ALT 103 albumin 1.5.  WBC 5.5 hemoglobin 9.1 platelets 289  Renal ultrasound showed sludge in the gallbladder but negative for acute cholecystitis. Echogenic right kidney with hydronephrosis.  This fullness is typical in pregnancy.  Ureteral jets argue against true obstruction.  Appreciate clarity from radiology Dr. Paulina FusiMark Shogry  Patient urinating although no recorded output according to nurse    Objective:  Vital signs in last 24 hours:  Temp:  [98.2 F (36.8 C)-98.3 F (36.8 C)] 98.2 F (36.8 C) (08/26 2333) Pulse Rate:  [89] 89 (08/26 2333) Resp:  [22] 22 (08/26 1620) BP: (101-115)/(59-67) 101/59 (08/26 2333) SpO2:  [98 %-99 %] 98 % (08/26 2333)  Weight change:  Filed Weights   11/05/18 0045  Weight: 72.6 kg    Intake/Output: I/O last 3 completed shifts: In: 2822.1 [P.O.:1310; I.V.:1512.1] Out: 500 [Urine:500]   Intake/Output this shift:  No intake/output data recorded.  Nondistressed alert pleasant history from nurse.  Cope with positive patient did not enter the room.   Basic Metabolic Panel: Recent Labs  Lab 11/04/18 1436 11/05/18 0521 11/06/18 0550 11/07/18 0507  NA 135 136 137 138  K 3.7 4.0 3.7 3.7  CL 105 109 114* 116*  CO2 16* 14* 15* 15*  GLUCOSE 91 67* 71 75  BUN 24* 25* 22* 18  CREATININE 2.75* 2.72* 2.55* 2.06*  CALCIUM 9.1 8.7* 8.2* 8.1*  MG  --  2.1 2.1 1.7  PHOS  --  4.0 3.2  2.6    Liver Function Tests: Recent Labs  Lab 11/04/18 1436 11/05/18 0521 11/06/18 0550 11/07/18 0507  AST 158* 164* 112* 90*  ALT 132* 148* 112* 103*  ALKPHOS 152* 131* 113 106  BILITOT 6.2* 6.0* 4.8* 3.9*  PROT 8.4* 7.1 5.9* 5.6*  ALBUMIN 2.3* 2.0* 1.6* 1.5*   Recent Labs  Lab 11/05/18 0521 11/07/18 0507  LIPASE 177* 143*   No results for input(s): AMMONIA in the last 168 hours.  CBC: Recent Labs  Lab 11/04/18 1436 11/05/18 0605 11/06/18 0550 11/07/18 0507  WBC 7.6 4.5 6.0 5.5  NEUTROABS 4.6 2.8 3.1 2.9  HGB 13.1 15.8* 9.2* 9.1*  HCT 39.7 47.6* 27.9* 27.2*  MCV 91.3 88.5 90.6 89.8  PLT 427* 250 293 289    Cardiac Enzymes: Recent Labs  Lab 11/05/18 0521 11/06/18 0550 11/07/18 0507  CKTOTAL 55 48 46    BNP: Invalid input(s): POCBNP  CBG: No results for input(s): GLUCAP in the last 168 hours.  Microbiology: Results for orders placed or performed during the hospital encounter of 11/04/18  Culture, blood (Routine X 2) w Reflex to ID Panel     Status: None (Preliminary result)   Collection Time: 11/05/18  5:21 AM   Specimen: BLOOD LEFT ARM  Result Value Ref Range Status   Specimen Description BLOOD LEFT ARM  Final   Special Requests   Final  BOTTLES DRAWN AEROBIC ONLY Blood Culture adequate volume   Culture   Final    NO GROWTH 1 DAY Performed at [facility], [address] [place], [place] [address]    Report Status PENDING  Incomplete  Culture, blood (Routine X 2) w Reflex to ID Panel     Status: None (Preliminary result)   Collection Time: 11/05/18  6:05 AM   Specimen: BLOOD LEFT ARM  Result Value Ref Range Status   Specimen Description BLOOD LEFT ARM  Final   Special Requests   Final    BOTTLES DRAWN AEROBIC ONLY Blood Culture adequate volume   Culture   Final    NO GROWTH 1 DAY Performed at [facility], [address] [place], [place] [address]    Report Status PENDING  Incomplete  Novel Coronavirus, NAA (Hosp order,  Send-out to Ref Lab; TAT 18-24 hrs     Status: None   Collection Time: 11/06/18  9:50 AM  Result Value Ref Range Status   SARS-CoV-2, NAA NOT DETECTED NOT DETECTED Final    Comment: (NOTE) This test was developed and its performance characteristics determined by [facility]. This test has not been FDA cleared or approved. This test has been authorized by FDA under an Emergency Use Authorization (EUA). This test is only authorized for the duration of time the declaration that circumstances exist justifying the authorization of the emergency use of in vitro diagnostic tests for detection of SARS-CoV-2 virus and/or diagnosis of COVID-19 infection under section 564(b)(1) of the Act, 21 U.S.C. 657QIO-9(G)(2360bbb-3(b)(1), unless the authorization is terminated or revoked sooner. When diagnostic testing is negative, the possibility of a false negative result should be considered in the context of a patient's recent exposures and the presence of clinical signs and symptoms consistent with COVID-19. An individual without symptoms of COVID-19 and who is not shedding SARS-CoV-2 virus would expect to have a negative (not detected) result in this assay. Performed  At: [facility] [place] [address] [place], [place] [id number] Jolene SchimkeNagendra Sanjai MD MW:[id number]:[phone]    Coronavirus Source NASOPHARYNGEAL  Final    Comment: Performed at [facility], [address] [place], [place] [address]    Coagulation Studies: No results for input(s): LABPROT, INR in the last 72 hours.  Urinalysis: Recent Labs    11/05/18 1314  COLORURINE AMBER*  LABSPEC 1.013  PHURINE 5.0  GLUCOSEU NEGATIVE  HGBUR MODERATE*  BILIRUBINUR NEGATIVE  KETONESUR 20*  PROTEINUR 100*  NITRITE NEGATIVE  LEUKOCYTESUR TRACE*      Imaging: [place] Renal  Result Date: 11/05/2018 CLINICAL DATA:  Third trimester pregnancy with diabetes and acute renal failure. EXAM: RENAL / URINARY TRACT ULTRASOUND COMPLETE COMPARISON:  CT  abdomen 06/30/2008 FINDINGS: Right Kidney: Renal measurements: 13 x 4.5 x 5.1 cm = volume: 158 mL. Increased echogenicity of the renal parenchyma. Mild fullness of the renal collecting system, often seen in third trimester pregnancy. Left Kidney: The left kidney was not identified by the technologist. Based on previous CT, this was low lying in the inferior left retroperitoneum and may have been inapparent based on the pregnancy. Bladder: Bilateral ureteral jets are visible. IMPRESSION: Right kidney is echogenic suggesting renal parenchymal disease. Fullness of the renal collecting system as is usually seen in third trimester pregnancy. Ureteral jet argues against true obstruction. The left kidney is not visualized by the technologist. 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 Left ureteral jet suggests that  there is a functioning left kidney present, but not seen by sonography. Electronically Signed   By: Nelson Chimes M.D.   On: 11/05/2018 15:29   Dg Chest Port 1 View  Result Date: 11/06/2018 CLINICAL DATA:  Onset of shortness of breath 2 day, history diabetes mellitus, Crohn's disease EXAM: PORTABLE CHEST 1 VIEW COMPARISON:  Portable exam 0933 hours compared to 11/04/2018 FINDINGS: Normal heart size, mediastinal contours, and pulmonary vascularity. Hazy opacities in the periphery of both lungs slightly greater on RIGHT, question atypical infiltrate. Remaining lungs clear. No pleural effusion or pneumothorax. Osseous structures unremarkable. IMPRESSION: Hazy opacities in the periphery of both lungs slightly greater on RIGHT question atypical pneumonia, minimally more prominent on LEFT than on prior study. Electronically Signed   By: Lavonia Dana M.D.   On: 11/06/2018 10:55   US Abdomen Limited Ruq  Result Date: 11/05/2018 CLINICAL DATA:  Thirty-five weeks pregnant, elevated bilirubin EXAM: ULTRASOUND ABDOMEN LIMITED RIGHT UPPER QUADRANT COMPARISON:  None.  FINDINGS: Gallbladder: Moderate sludge in the gallbladder. Normal wall thickness. Negative sonographic Murphy. Common bile duct: Diameter: 3.3 mm Liver: No focal lesion identified. Within normal limits in parenchymal echogenicity. Portal vein is patent on color Doppler imaging with normal direction of blood flow towards the liver. Other: Echogenic right kidney with hydronephrosis IMPRESSION: 1. Sludge in the gallbladder. Negative for acute cholecystitis or biliary dilatation 2. Echogenic right kidney with hydronephrosis Electronically Signed   By: Donavan Foil M.D.   On: 11/05/2018 22:17     Medications:   . sodium chloride 75 mL/hr at 11/05/18 1000  . sodium chloride 100 mL/hr at 11/06/18 0950   . enoxaparin (LOVENOX) injection  30 mg Subcutaneous Q24H  . multivitamin with minerals  1 tablet Oral Daily  . vitamin C  500 mg Oral Daily  . zinc sulfate  220 mg Oral Daily   chlorpheniramine-HYDROcodone, ondansetron (ZOFRAN) IV  Assessment/ Plan:   Acute kidney injury setting of third trimester pregnancy appears to be volume depleted may have an element of ATN creatinine appears to be improving with hydration.  There was fullness noted in the right collecting system.  This would be consistent with third trimester pregnancy.  Urinalysis cloudy urine culture pending.  We will continue IV fluids at 100 cc an hour.  Creatinine appears to be improving with hydration we will continue to follow.  Metabolic acidosis/respiratory alkalosis.  Consistent with pregnancy  Hypoalbuminemia consistent with pregnancy  Crohn's disease patient receives Remicade every 4 weeks  COVID pneumonia continue supportive care  Third trimester pregnancy close follow-up with OB/GYN and fetal maternal health medicine   LOS: 2 Sherril Croon @TODAY @8 :51 AM

## 2018-11-07 NOTE — Progress Notes (Signed)
Assisted patient after she finished her bath.  Patient breathing normal, hoping to go home tomorrow.  Telemetry reapplied and partial linen change, patient tucked in, water at bedside, no further requests at this time.

## 2018-11-07 NOTE — Progress Notes (Signed)
PROGRESS NOTE    Marissa Adams  JQG:[id number] DOB: 08/16/1985 DOA: 11/04/2018 PCP: Patient, No Pcp Per  Brief Narrative:  Patient is a 33 year old overweight African-American female with a past medical history significant for Crohn's disease on Remicade every 4 weeks, as well as history of sickle cell trait, and other comorbidities is [redacted] weeks pregnant who presented with nausea vomiting as well as decreased fetal movement and contractions.  Patient states that her symptoms developed on 7 August with nausea vomiting and she tested positive for COVID done as her husband initially tested positive.  She developed some initial coughing with some nausea vomiting and body aches with loss of taste and smell but no associated chest pain or shortness of breath.  She has a history of diabetes mellitus in 2014.  She was admitted for an acute kidney injury as well as metabolic acidosis.  There is also concern for pneumonia due to COVID-19 virus.  Incidentally her transaminases were elevated and have been trending down.  OB/GYN and nephrology were consulted and are following.  Assessment & Plan:   Active Problems:   Crohn disease ([place])   AKI (acute kidney injury) ([place])   Pneumonia due to COVID-19 virus   Transaminitis   Pregnancy and infectious disease in third trimester  Acute Kidney Injury, improving slowly  Hyperchloremia  -In the setting of dehydration as baseline creatinine 0.7; patient BUN/creatinine was 24/2.75 -Continue IV fluid resuscitation with normal saline rate of 100 mL's per hour per Nephrology Recc's; may need to Change to D5W given Hyperchloremia (116) -Nephrology feels she may have an element of ATN as her creatinine is improving with hydration -Renal ultrasound showed a fullness in the right renal collecting system and is consistent with a third trimester pregnancy. -BUN/creatinine is improving and is now 18/2.06; on admission was 24/2.75 -Avoid nephrotoxic medications, contrast  dyes as well as hypotension if possible -Renal U/S showed "Right kidney is echogenic suggesting renal parenchymal disease. Fullness of the renal collecting system as is usually seen in third trimester pregnancy. Ureteral jet argues against true obstruction. The left kidney is not visualized by the technologist. 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 Left ureteral jet suggests that there is a functioning left kidney present, but not seen by sonography." -Hepatitis serologies negative so far -Repeat urinalysis showed the appearance with amber color urine, moderate hemoglobin, 20 ketones, trace leukocytes, negative nitrites, rare bacteria, 0-5 non-squamous epithelial cells, 21-50 squamous epithelial cells, 6-10 WBCs -Urine culture still pending -Urine protein creatinine ratio was 0.83, urine sodium was 41, and urine creatinine was 146.28 -Continue monitor and trend renal function; appreciate further nephrology recommendations -Repeat CMP in a.m.   Abnormal LFTs/elevated transaminases, improving Hyperbilirubinemia associated with scleral icterus and jaundice, improving slowly -Likely in the setting of COVID disease and possibly other etiology such as pancreatitis and trending down -Patient's AST peaked to 164 and ALT peaked to 148 and both of trended down; now AST is 90 and ALT is 103 -T bili was 6.2 is now trending down to 3.9 -Right upper quadrant ultrasound showed "Sludge in the gallbladder. Negative for acute cholecystitis or biliary dilatation. Echogenic right kidney with hydronephrosis -OB/GYN does not feel that this is secondary to preeclampsia due to normotensive blood pressures -Lipase was elevated at 177 and will trend and monitor and repeat in the a.m. -Continue to monitor trend -Repeat CMP in a.m.  Normocytic Anemia -Patient's hemoglobin/hematocrit had a dramatic drop from 15.8/47.6 and went  to 9.1/27.2 -Likely in the setting of  dilutional drop -Check anemia panel on iron level showed 126, U IBC is 137, TIBC was 263, saturation ratios were 48%, ferritin level was 357, folate level was 16.5, vitamin B12 was 35.9; repeat ferritin level is now 294 -Check FOBT and patient states that stool sample was collected -Continue to monitor for signs and symptoms of bleeding; currently no overt bleeding noted -May need some IV iron and will also defer further anemia work-up to OB/GYN; OB/GYN says that they may add iron supplements at home -Repeat CBC in a.m.  Metabolic Acidosis with Compensated Respiratory Alkalosis -Likely in the setting of pregnancy -Continue with IV fluids per Nephrology recs at 100 mL's per hour -Patient's CO2 is now 15, chloride levels 116, and anion gap is 8 -Continue monitor trend and may need to change IV fluid hydration from normal saline to D5  COVID-19 Infection with associated viral pneumonia Elevated D-dimer -Patient had a mild cough otherwise she was asymptomatic; there were infiltrates seen on chest x-ray but she is afebrile and has no hypoxia -CXR yesterday showed "Hazy opacities in the periphery of both lungs slightly greater on RIGHT question atypical pneumonia, minimally more prominent on LEFT than on prior study." -Repeat COVID screening negative; she tested positive on August 8; Per OB-GYN discussion with Dr. Megan Salon of ID, even if her COVID repeat test was negative she is still considered infectious needs to maintain isolation given her mild symptoms; will discuss with infection prevention about her negative cover testing -CRP is trending downward from 3.4 and is now 0.9 -Triglycerides is also trending down from 345 -> 215 -CK total was 48 -LDH is trending down as well over from 334 is now 280 -Procalcitonin level of 0.75 and 0.77 -Lactic acid level was normal and was 1.1 -ESR was 124 and repeat has trended down to 99 -D-dimer is relatively stable and went from 5.95 -> 5.50 -Fibrinogen is  trending down from greater than 800 and is now 707 -Continue supportive care and continue to monitor inflammatory markers -Continue Tussionex as needed  Pregnancy in the third trimester -Patient is [redacted] weeks pregnant next-she is evaluated by OB/GYN as well as maternal-fetal medicine over the telephone -OB-GYN and MFM feel there is no indication for delivery or corticosteroids for fetal lung maturity per MFM -They are recommending continuing NSTs daily from OB rapid response note -They will be to be checking a GBS next week -Per maternal-fetal medicine BPP's or ultrasound not needed at this time as last ultrasound fetus showing is appropriate going next-ultimate goal is for a NSVD for patient and only C-section for obstetric or maternal deterioration  Crohn's Disease -Currently on Remicade every 4 weeks and this is being held -States that she has 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  DVT prophylaxis: SCDs as OB/GYN does not recommend Lovenox with elevated D-dimers Code Status: FULL CODE Family Communication: No family present at bedside  Disposition Plan: Pending further improvement in renal function and inflammatory markers and possible discharge in the next 24 to 48 hours; since she is COVID negative may be able to transfer off the pulmonary floor  Consultants:   Nephrology   Maternal Fetal Medicine  OB-GYN   Procedures: NST  Antimicrobials:  Anti-infectives (From admission, onward)   None     Subjective: Seen and examined at bedside and states that she is feeling good today but did vomit earlier this morning.  No nausea or vomiting now.  No chest pain,  answer dizziness.  Has a mild cough but feels like her shortness of breath is improved.  No other concerns or complaints at this time.  Objective: Vitals:   11/05/18 2246 11/06/18 0815 11/06/18 1620 11/06/18 2333  BP: 126/73 117/77 115/67 (!) 101/59  Pulse: (!) 105   89  Resp: 16 16 (!) 22   Temp: 98.1 F (36.7 C)  98.2 F (36.8 C) 98.3 F (36.8 C) 98.2 F (36.8 C)  TempSrc: Oral Oral Oral Oral  SpO2: 100% 100% 99% 98%  Weight:      Height:        Intake/Output Summary (Last 24 hours) at 11/07/2018 1551 Last data filed at 11/07/2018 1100 Gross per 24 hour  Intake 600 ml  Output 1000 ml  Net -400 ml   Filed Weights   11/05/18 0045  Weight: 72.6 kg   Examination: Physical Exam:  Constitutional: Well-nourished, well-developed overweight African-American female currently in no acute distress appears calm and comfortable sitting up in bed Eyes: Lids and conjunctive are normal.  Sclera is icteric ENMT: External ears nose appear normal for grossly normal hearing Neck: Appears supple with no JVD Respiratory: Diminished auscultation bilaterally no appreciable wheezing, rales, rhonchi.  Patient not tachypneic wheezing excess muscle breathe Cardiovascular: Regular rate and rhythm.  No appreciable murmurs, rubs, gallops.  No appreciable lower extremity edema noted Abdomen: Soft, nontender, nondistended.  Bowel sounds present GU: Deferred Musculoskeletal: No contractures or cyanosis.  No joint deformities in upper extremities Skin: No appreciable rashes or lesions on her skin evaluation Neurologic: Cranial nerves II through XII grossly intact no appreciable focal deficits. Psychiatric: Normal judgment and insight.  Patient is awake, alert, oriented x3.  Data Reviewed: I have personally reviewed following labs and imaging studies  CBC: Recent Labs  Lab 11/04/18 1436 11/05/18 0605 11/06/18 0550 11/07/18 0507  WBC 7.6 4.5 6.0 5.5  NEUTROABS 4.6 2.8 3.1 2.9  HGB 13.1 15.8* 9.2* 9.1*  HCT 39.7 47.6* 27.9* 27.2*  MCV 91.3 88.5 90.6 89.8  PLT 427* 250 293 161   Basic Metabolic Panel: Recent Labs  Lab 11/04/18 1436 11/05/18 0521 11/06/18 0550 11/07/18 0507  NA 135 136 137 138  K 3.7 4.0 3.7 3.7  CL 105 109 114* 116*  CO2 16* 14* 15* 15*  GLUCOSE 91 67* 71 75  BUN 24* 25* 22* 18   CREATININE 2.75* 2.72* 2.55* 2.06*  CALCIUM 9.1 8.7* 8.2* 8.1*  MG  --  2.1 2.1 1.7  PHOS  --  4.0 3.2 2.6   GFR: Estimated Creatinine Clearance: 37.6 mL/min (A) (by C-G formula based on SCr of 2.06 mg/dL (H)). Liver Function Tests: Recent Labs  Lab 11/04/18 1436 11/05/18 0521 11/06/18 0550 11/07/18 0507  AST 158* 164* 112* 90*  ALT 132* 148* 112* 103*  ALKPHOS 152* 131* 113 106  BILITOT 6.2* 6.0* 4.8* 3.9*  PROT 8.4* 7.1 5.9* 5.6*  ALBUMIN 2.3* 2.0* 1.6* 1.5*   Recent Labs  Lab 11/05/18 0521 11/07/18 0507  LIPASE 177* 143*   No results for input(s): AMMONIA in the last 168 hours. Coagulation Profile: No results for input(s): INR, PROTIME in the last 168 hours. Cardiac Enzymes: Recent Labs  Lab 11/05/18 0521 11/06/18 0550 11/07/18 0507  CKTOTAL 55 48 46   BNP (last 3 results) No results for input(s): PROBNP in the last 8760 hours. HbA1C: No results for input(s): HGBA1C in the last 72 hours. CBG: No results for input(s): GLUCAP in the last 168 hours. Lipid Profile: Recent  Labs    11/06/18 0550 11/07/18 0507  TRIG 280* 215*   Thyroid Function Tests: No results for input(s): TSH, T4TOTAL, FREET4, T3FREE, THYROIDAB in the last 72 hours. Anemia Panel: Recent Labs    11/06/18 0921 11/07/18 0507  VITAMINB12 359  --   FOLATE 16.5  --   FERRITIN 357* 294  TIBC 263  --   IRON 126  --   RETICCTPCT 1.2  --    Sepsis Labs: Recent Labs  Lab 11/04/18 1748 11/05/18 0521 11/05/18 0605  PROCALCITON 0.75 0.77  --   LATICACIDVEN 1.8  --  1.1    Recent Results (from the past 240 hour(s))  Culture, blood (Routine X 2) w Reflex to ID Panel     Status: None (Preliminary result)   Collection Time: 11/05/18  5:21 AM   Specimen: BLOOD LEFT ARM  Result Value Ref Range Status   Specimen Description BLOOD LEFT ARM  Final   Special Requests   Final    BOTTLES DRAWN AEROBIC ONLY Blood Culture adequate volume   Culture   Final    NO GROWTH 2 DAYS Performed at  [facility] [address], [place], [place] [address]    Report Status PENDING  Incomplete  Culture, blood (Routine X 2) w Reflex to ID Panel     Status: None (Preliminary result)   Collection Time: 11/05/18  6:05 AM   Specimen: BLOOD LEFT ARM  Result Value Ref Range Status   Specimen Description BLOOD LEFT ARM  Final   Special Requests   Final    BOTTLES DRAWN AEROBIC ONLY Blood Culture adequate volume   Culture   Final    NO GROWTH 2 DAYS Performed at [facility], [place] [address], [place], [place] [address]    Report Status PENDING  Incomplete  Novel Coronavirus, NAA (Hosp order, Send-out to Ref Lab; TAT 18-24 hrs     Status: None   Collection Time: 11/06/18  9:50 AM  Result Value Ref Range Status   SARS-CoV-2, NAA NOT DETECTED NOT DETECTED Final    Comment: (NOTE) This test was developed and its performance characteristics determined by [employer]. This test has not been FDA cleared or approved. This test has been authorized by FDA under an Emergency Use Authorization (EUA). This test is only authorized for the duration of time the declaration that circumstances exist justifying the authorization of the emergency use of in vitro diagnostic tests for detection of SARS-CoV-2 virus and/or diagnosis of COVID-19 infection under section 564(b)(1) of the Act, 21 U.S.C. 826EBR-8(X)(0), unless the authorization is terminated or revoked sooner. When diagnostic testing is negative, the possibility of a false negative result should be considered in the context of a patient's recent exposures and the presence of clinical signs and symptoms consistent with COVID-19. An individual without symptoms of COVID-19 and who is not shedding SARS-CoV-2 virus would expect to have a negative (not detected) result in this assay. Performed  At: [facility] [address] [place], [place] [id number] Rush Farmer MD PJ:[id number]    [place]   Final    Comment: Performed at [facility], [place] [address], [place], [place] [address]    Radiology Studies: Dg Chest Port 1 View  Result Date: 11/06/2018 CLINICAL DATA:  Onset of shortness of breath 2 day, history diabetes mellitus, Crohn's disease EXAM: PORTABLE CHEST 1 VIEW COMPARISON:  Portable exam 0933 hours compared to 11/04/2018 FINDINGS: Normal heart size, mediastinal contours,  and pulmonary vascularity. Hazy opacities in the periphery of both lungs slightly greater on RIGHT, question atypical infiltrate. Remaining lungs clear. No pleural effusion or pneumothorax. Osseous structures unremarkable. IMPRESSION: Hazy opacities in the periphery of both lungs slightly greater on RIGHT question atypical pneumonia, minimally more prominent on LEFT than on prior study. Electronically Signed   By: Lavonia Dana M.D.   On: 11/06/2018 10:55   US Abdomen Limited Ruq  Result Date: 11/05/2018 CLINICAL DATA:  Thirty-five weeks pregnant, elevated bilirubin EXAM: ULTRASOUND ABDOMEN LIMITED RIGHT UPPER QUADRANT COMPARISON:  None. FINDINGS: Gallbladder: Moderate sludge in the gallbladder. Normal wall thickness. Negative sonographic Murphy. Common bile duct: Diameter: 3.3 mm Liver: No focal lesion identified. Within normal limits in parenchymal echogenicity. Portal vein is patent on color Doppler imaging with normal direction of blood flow towards the liver. Other: Echogenic right kidney with hydronephrosis IMPRESSION: 1. Sludge in the gallbladder. Negative for acute cholecystitis or biliary dilatation 2. Echogenic right kidney with hydronephrosis Electronically Signed   By: Donavan Foil M.D.   On: 11/05/2018 22:17   Scheduled Meds: . enoxaparin (LOVENOX) injection  30 mg Subcutaneous Q24H  . multivitamin with minerals  1 tablet Oral Daily  . sodium chloride flush  3 mL Intravenous Q12H  . vitamin C  500 mg Oral Daily  . zinc sulfate  220 mg Oral Daily   Continuous Infusions: . sodium chloride 75 mL/hr  at 11/05/18 1000  . sodium chloride 100 mL/hr at 11/06/18 0950    LOS: 2 days   Kerney Elbe, DO [facility] PAGER is on AMION  If 7PM-7AM, please contact night-coverage [email or web address] Password [facility] 11/07/2018, 3:51 PM

## 2018-11-07 NOTE — Progress Notes (Signed)
FYI sent to [facility] NP that this RN is holding the Lovenox due to both hospitalist and OB notes today stating NO Lovenox.  Patient has SCDs on and is ambulating in the room.  Patient's MEWS is green, she has no complaints at this time and appears comfortable, resps easy with a dry cough.

## 2018-11-07 NOTE — Progress Notes (Signed)
Daily fetal NST completed. FHR Category I. Irreg mild UC's. Pt reports that UC's are not painful.  Pt denies vaginal bleeding or LOF and reports good fetal movement.

## 2018-11-08 LAB — COMPREHENSIVE METABOLIC PANEL
ALT: 107 U/L — ABNORMAL HIGH (ref 0–44)
AST: 89 U/L — ABNORMAL HIGH (ref 15–41)
Albumin: 1.6 g/dL — ABNORMAL LOW (ref 3.5–5.0)
Alkaline Phosphatase: 104 U/L (ref 38–126)
Anion gap: 8 (ref 5–15)
BUN: 12 mg/dL (ref 6–20)
CO2: 16 mmol/L — ABNORMAL LOW (ref 22–32)
Calcium: 8 mg/dL — ABNORMAL LOW (ref 8.9–10.3)
Chloride: 115 mmol/L — ABNORMAL HIGH (ref 98–111)
Creatinine, Ser: 1.77 mg/dL — ABNORMAL HIGH (ref 0.44–1.00)
GFR calc Af Amer: 43 mL/min — ABNORMAL LOW (ref >60)
GFR calc non Af Amer: 37 mL/min — ABNORMAL LOW (ref >60)
Glucose, Bld: 70 mg/dL (ref 70–99)
Potassium: 3.7 mmol/L (ref 3.5–5.1)
Sodium: 139 mmol/L (ref 135–145)
Total Bilirubin: 2.9 mg/dL — ABNORMAL HIGH (ref 0.3–1.2)
Total Protein: 5.4 g/dL — ABNORMAL LOW (ref 6.5–8.1)

## 2018-11-08 LAB — CBC WITH DIFFERENTIAL/PLATELET
Abs Immature Granulocytes: 0.02 10*3/uL (ref 0.00–0.07)
Basophils Absolute: 0 10*3/uL (ref 0.0–0.1)
Basophils Relative: 0 %
Eosinophils Absolute: 0.1 10*3/uL (ref 0.0–0.5)
Eosinophils Relative: 1 %
HCT: 27.3 % — ABNORMAL LOW (ref 36.0–46.0)
Hemoglobin: 9.1 g/dL — ABNORMAL LOW (ref 12.0–15.0)
Immature Granulocytes: 0 %
Lymphocytes Relative: 31 %
Lymphs Abs: 1.9 10*3/uL (ref 0.7–4.0)
MCH: 30 pg (ref 26.0–34.0)
MCHC: 33.3 g/dL (ref 30.0–36.0)
MCV: 90.1 fL (ref 80.0–100.0)
Monocytes Absolute: 0.8 10*3/uL (ref 0.1–1.0)
Monocytes Relative: 13 %
Neutro Abs: 3.2 10*3/uL (ref 1.7–7.7)
Neutrophils Relative %: 55 %
Platelets: 235 10*3/uL (ref 150–400)
RBC: 3.03 MIL/uL — ABNORMAL LOW (ref 3.87–5.11)
RDW: 13.3 % (ref 11.5–15.5)
WBC: 5.9 10*3/uL (ref 4.0–10.5)
nRBC: 0 % (ref 0.0–0.2)

## 2018-11-08 LAB — SEDIMENTATION RATE: Sed Rate: 85 mm/hr — ABNORMAL HIGH (ref 0–22)

## 2018-11-08 LAB — C-REACTIVE PROTEIN: CRP: <0.8 mg/dL (ref <1.0)

## 2018-11-08 LAB — INTERLEUKIN-6, PLASMA: Interleukin-6, Plasma: 7.5 pg/mL (ref 0.0–12.2)

## 2018-11-08 LAB — TRIGLYCERIDES: Triglycerides: 251 mg/dL — ABNORMAL HIGH (ref <150)

## 2018-11-08 LAB — PHOSPHORUS: Phosphorus: 3 mg/dL (ref 2.5–4.6)

## 2018-11-08 LAB — CK: Total CK: 56 U/L (ref 38–234)

## 2018-11-08 LAB — FERRITIN: Ferritin: 255 ng/mL (ref 11–307)

## 2018-11-08 LAB — D-DIMER, QUANTITATIVE: D-Dimer, Quant: 6.52 ug/mL-FEU — ABNORMAL HIGH (ref 0.00–0.50)

## 2018-11-08 LAB — MAGNESIUM: Magnesium: 1.4 mg/dL — ABNORMAL LOW (ref 1.7–2.4)

## 2018-11-08 MED ORDER — MAGNESIUM SULFATE 2 GM/50ML IV SOLN
2.0000 g | Freq: Once | INTRAVENOUS | Status: AC
  Administered 2018-11-08: 2 g via INTRAVENOUS
  Filled 2018-11-08: qty 50

## 2018-11-08 MED ORDER — ZINC SULFATE 220 (50 ZN) MG PO CAPS: 220.0000 mg | ORAL_CAPSULE | Freq: Every day | ORAL | 0 refills | Status: DC

## 2018-11-08 MED ORDER — HYDROCOD POLST-CPM POLST ER 10-8 MG/5ML PO SUER: 5.0000 mL | Freq: Two times a day (BID) | ORAL | 0 refills | Status: DC | PRN

## 2018-11-08 MED ORDER — ASCORBIC ACID 500 MG PO TABS: 500.0000 mg | ORAL_TABLET | Freq: Every day | ORAL | 0 refills | Status: DC

## 2018-11-08 NOTE — Progress Notes (Signed)
Patient states she voided twice last night.  Lab at bedside to take blood.  She is goin to try to go back to sleep.

## 2018-11-08 NOTE — Progress Notes (Signed)
Patient sleeping soundly, respirations easy at 16, oxygen saturation 100% on Room Air.

## 2018-11-08 NOTE — Progress Notes (Signed)
Nurse Peri Maris was in to see patient to perform routine vital signs.  Patient had no complaints at that time.

## 2018-11-08 NOTE — Progress Notes (Signed)
Patient alert and oriented upon intentional rounding.  Denied any needs at this time.  Call bell in reach.

## 2018-11-08 NOTE — Progress Notes (Signed)
[facility] ROUNDING NOTE   Subjective:   This is a very pleasant [age] lady with history of Crohn's disease.  She developed COVID August 7.  She is [redacted] weeks pregnant..  She was found to have acute kidney  failure.  This appears to be in the setting of volume depletion.  She has been evaluated by OB/GYN as well as telemetry visit from fetomaternal medicine.    Blood pressure 102/65 pulse 103 temperature 98.2  Sodium 139 potassium 3.7 chloride 115 CO2 16 BUN 12 creatinine 1.77 calcium 8.0 phosphorus 3.0 magnesium 1.4 AST 89 ALT 107 CK 56  Renal ultrasound showed sludge in the gallbladder but negative for acute cholecystitis. Echogenic right kidney with hydronephrosis.  This fullness is typical in pregnancy.  Ureteral jets argue against true obstruction.  Appreciate clarity from radiology Dr. Paulina FusiMark Shogry  Urine output 1 L 11/07/2018    Objective:  Vital signs in last 24 hours:  Temp:  [98.2 F (36.8 C)-98.3 F (36.8 C)] 98.2 F (36.8 C) (08/28 0848) Pulse Rate:  [88-97] 97 (08/28 0848) Resp:  [18-20] 18 (08/28 0848) BP: (97-123)/(61-81) 102/65 (08/28 0848) SpO2:  [99 %-100 %] 99 % (08/28 0848)  Weight change:  Filed Weights   11/05/18 0045  Weight: 72.6 kg    Intake/Output: I/O last 3 completed shifts: In: 4703.3 [P.O.:1100; I.V.:3603.3] Out: 1000 [Urine:1000]   Intake/Output this shift:  No intake/output data recorded.  Nondistressed alert pleasant history from nurse.  Cope with positive patient did not enter the room.   Basic Metabolic Panel: Recent Labs  Lab 11/04/18 1436 11/05/18 0521 11/06/18 0550 11/07/18 0507 11/08/18 0558  NA 135 136 137 138 139  K 3.7 4.0 3.7 3.7 3.7  CL 105 109 114* 116* 115*  CO2 16* 14* 15* 15* 16*  GLUCOSE 91 67* 71 75 70  BUN 24* 25* 22* 18 12  CREATININE 2.75* 2.72* 2.55* 2.06* 1.77*  CALCIUM 9.1 8.7* 8.2* 8.1* 8.0*  MG  --  2.1 2.1 1.7 1.4*  PHOS  --  4.0 3.2 2.6 3.0    Liver Function Tests: Recent Labs  Lab  11/04/18 1436 11/05/18 0521 11/06/18 0550 11/07/18 0507 11/08/18 0558  AST 158* 164* 112* 90* 89*  ALT 132* 148* 112* 103* 107*  ALKPHOS 152* 131* 113 106 104  BILITOT 6.2* 6.0* 4.8* 3.9* 2.9*  PROT 8.4* 7.1 5.9* 5.6* 5.4*  ALBUMIN 2.3* 2.0* 1.6* 1.5* 1.6*   Recent Labs  Lab 11/05/18 0521 11/07/18 0507  LIPASE 177* 143*   No results for input(s): AMMONIA in the last 168 hours.  CBC: Recent Labs  Lab 11/04/18 1436 11/05/18 0605 11/06/18 0550 11/07/18 0507 11/08/18 0558  WBC 7.6 4.5 6.0 5.5 5.9  NEUTROABS 4.6 2.8 3.1 2.9 3.2  HGB 13.1 15.8* 9.2* 9.1* 9.1*  HCT 39.7 47.6* 27.9* 27.2* 27.3*  MCV 91.3 88.5 90.6 89.8 90.1  PLT 427* 250 293 289 235    Cardiac Enzymes: Recent Labs  Lab 11/05/18 0521 11/06/18 0550 11/07/18 0507 11/08/18 0558  CKTOTAL 55 48 46 56    BNP: Invalid input(s): POCBNP  CBG: No results for input(s): GLUCAP in the last 168 hours.  Microbiology: Results for orders placed or performed during the hospital encounter of 11/04/18  Culture, blood (Routine X 2) w Reflex to ID Panel     Status: None (Preliminary result)   Collection Time: 11/05/18  5:21 AM   Specimen: BLOOD LEFT ARM  Result Value Ref Range Status   Specimen  Description BLOOD LEFT ARM  Final   Special Requests   Final    BOTTLES DRAWN AEROBIC ONLY Blood Culture adequate volume   Culture   Final    NO GROWTH 2 DAYS Performed at [facility], [address] [place], [place] [address]    Report Status PENDING  Incomplete  Culture, blood (Routine X 2) w Reflex to ID Panel     Status: None (Preliminary result)   Collection Time: 11/05/18  6:05 AM   Specimen: BLOOD LEFT ARM  Result Value Ref Range Status   Specimen Description BLOOD LEFT ARM  Final   Special Requests   Final    BOTTLES DRAWN AEROBIC ONLY Blood Culture adequate volume   Culture   Final    NO GROWTH 2 DAYS Performed at [facility], [place] [address] [place], [place] [address]    Report Status  PENDING  Incomplete  Novel Coronavirus, NAA (Hosp order, Send-out to Ref Lab; TAT 18-24 hrs     Status: None   Collection Time: 11/06/18  9:50 AM  Result Value Ref Range Status   SARS-CoV-2, NAA NOT DETECTED NOT DETECTED Final    Comment: (NOTE) This test was developed and its performance characteristics determined by [employer]. This test has not been FDA cleared or approved. This test has been authorized by FDA under an Emergency Use Authorization (EUA). This test is only authorized for the duration of time the declaration that circumstances exist justifying the authorization of the emergency use of in vitro diagnostic tests for detection of SARS-CoV-2 virus and/or diagnosis of COVID-19 infection under section 564(b)(1) of the Act, 21 U.S.C. 381OFB-5(Z)(0), unless the authorization is terminated or revoked sooner. When diagnostic testing is negative, the possibility of a false negative result should be considered in the context of a patient's recent exposures and the presence of clinical signs and symptoms consistent with COVID-19. An individual without symptoms of COVID-19 and who is not shedding SARS-CoV-2 virus would expect to have a negative (not detected) result in this assay. Performed  At: [facility] [address] [place], [place] [id number] Rush Farmer MD UM:[id number]    [place]  Final    Comment: Performed at [facility], [place] [address] [place], [place] [address]  Urine Culture     Status: Abnormal   Collection Time: 11/06/18 10:45 PM   Specimen: Urine, Random  Result Value Ref Range Status   Specimen Description URINE, RANDOM  Final   Special Requests   Final    NONE Performed at [facility], [place] [address], [place], [place] [address]    Culture MULTIPLE SPECIES PRESENT, SUGGEST RECOLLECTION (A)  Final   Report Status 11/07/2018 FINAL  Final    Coagulation Studies: No results for input(s): LABPROT,  INR in the last 72 hours.  Urinalysis: Recent Labs    11/05/18 1314  COLORURINE AMBER*  LABSPEC 1.013  PHURINE 5.0  GLUCOSEU NEGATIVE  HGBUR MODERATE*  BILIRUBINUR NEGATIVE  KETONESUR 20*  PROTEINUR 100*  NITRITE NEGATIVE  LEUKOCYTESUR TRACE*      Imaging: No results found.   Medications:   . sodium chloride 75 mL/hr at 11/05/18 1000  . sodium chloride 100 mL/hr at 11/08/18 0507  . magnesium sulfate bolus IVPB 2 g (11/08/18 0923)   . enoxaparin (LOVENOX) injection  30 mg Subcutaneous Q24H  . multivitamin with minerals  1 tablet Oral Daily  . sodium chloride flush  3 mL Intravenous  Q12H  . vitamin C  500 mg Oral Daily  . zinc sulfate  220 mg Oral Daily   acetaminophen, chlorpheniramine-HYDROcodone, HYDROcodone-acetaminophen, ondansetron (ZOFRAN) IV, ondansetron **OR** ondansetron (ZOFRAN) IV  Assessment/ Plan:   Acute kidney injury setting of third trimester pregnancy appears to be volume depleted may have an element of ATN creatinine appears to be improving with hydration.  There was fullness noted in the right collecting system.  This would be consistent with third trimester pregnancy.  Urinalysis cloudy urine culture pending..  Creatinine appears to be improving with hydration we will continue to follow.  Will discontinue IV fluids at this time.  We will continue to follow renal function.  Metabolic acidosis/respiratory alkalosis.  Consistent with pregnancy  Hypoalbuminemia consistent with pregnancy  Crohn's disease patient receives Remicade every 4 weeks  COVID pneumonia continue supportive care  Third trimester pregnancy close follow-up with OB/GYN and fetal maternal health medicine   LOS: 3 Garnetta Buddy @TODAY @9 :51 AM

## 2018-11-08 NOTE — Discharge Summary (Addendum)
Physician Discharge Summary  Marissa Adams ZSW:[id number] DOB: 1985/09/12 DOA: 11/04/2018  PCP: Patient, No Pcp Per  Admit date: 11/04/2018 Discharge date: 11/08/2018  Admitted From: Home Disposition: Home  Recommendations for Outpatient Follow-up:  1. Follow up with PCP in 1-2 weeks 2. Follow-up with OB/GYN within 1 week and have repeat ultrasound done 3. Follow-up with nephrology in the outpatient setting if necessary 4. Please obtain CMP/CBC, Mag, Phos on Monday 11/11/2018 and call results to Dr. Edrick Oh 5. Repeat chest x-ray in 3 to 6 weeks 6. Please follow up on the following pending results:  Home Health: No Equipment/Devices: No   Discharge Condition: None  CODE STATUS: Stable Diet recommendation: Regular Diet    Brief/Interim Summary: Patient is a 33 year old overweight African-American female with a past medical history significant for Crohn's disease on Remicade every 4 weeks, as well as history of sickle cell trait, and other comorbidities is [redacted] weeks pregnant who presented with nausea vomiting as well as decreased fetal movement and contractions.  Patient states that her symptoms developed on 7 August with nausea vomiting and she tested positive for COVID done as her husband initially tested positive.  She developed some initial coughing with some nausea vomiting and body aches with loss of taste and smell but no associated chest pain or shortness of breath.  She has a history of diabetes mellitus in 2014.  She was admitted for an acute kidney injury as well as metabolic acidosis.  There is also concern for pneumonia due to COVID-19 virus.  Incidentally her transaminases were elevated and have been trending down.  OB/GYN and nephrology were consulted and are following.  Patient's respiratory symptoms are stable and improved and she had a minimal cough.  Inflammatory markers for chronic disease have trended down.  Renal function was improving with IV fluid hydration.  Liver  functions improved as well but were still elevated prior to discharge.  Nephrology deemed the patient stable from discharge from their perspective as well as OB/GYN.  Patient states that she feels better since coming in and after getting fluid hydrated.  States that she still continues to vomit daily but likely secondary to her pregnancy as this is only in the mornings.  No other concerns or complaints at this time and she is stable for discharge at this time.  She is given home isolation guidelines for COVID and understands she needs to follow-up with PCP as well as OB/GYN and nephrology outpatient setting and she is agreeable with the plan of care.  Discharge Diagnoses:  Active Problems:   Crohn disease ([place])   AKI (acute kidney injury) ([place])   Pneumonia due to COVID-19 virus   Transaminitis   Pregnancy and infectious disease in third trimester  Acute Kidney Injury, improving slowly  Hyperchloremia  -In the setting of dehydration as baseline creatinine 0.7; patient BUN/creatinine was 24/2.75 -Continued IV fluid resuscitation with normal saline rate of 100 mL's per hour per Nephrology Recc's; may need to Change to D5W given Hyperchloremia (115) however nephrology has now discontinued fluid hydration altogether given her improvement and recommends oral hydration and repeating labs on Monday -Nephrology feels she may have an element of ATN as her creatinine is improving with hydration -Renal ultrasound showed a fullness in the right renal collecting system and is consistent with a third trimester pregnancy. -BUN/creatinine is improving and is now 12/1.77; on admission was 24/2.75 -Avoid nephrotoxic medications, contrast dyes as well as hypotension if possible -Renal U/S showed "Right kidney is  echogenic suggesting renal parenchymal disease. Fullness of the renal collecting system as is usually seen in third trimester pregnancy. Ureteral jet argues against true obstruction. The left kidney is not  visualized by the technologist. 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 Left ureteral jet suggests that there is a functioning left kidney present, but not seen by sonography." -Hepatitis serologies negative so far -Repeat urinalysis showed the appearance with amber color urine, moderate hemoglobin, 20 ketones, trace leukocytes, negative nitrites, rare bacteria, 0-5 non-squamous epithelial cells, 21-50 squamous epithelial cells, 6-10 WBCs -Urine culture still pending -Urine protein creatinine ratio was 0.83, urine sodium was 41, and urine creatinine was 146.28 -Continue monitor and trend renal function; appreciate further nephrology recommendations -I spoke with nephrology Dr. Justin Mend who recommends patient can be discharged home and follow-up closely with repeat blood work on Monday.  Dr. Justin Mend requests that the blood work done by the OB/GYN be called to him directly  Abnormal LFTs/elevated transaminases, improving and stable Hyperbilirubinemia associated with scleral icterus and jaundice, improving slowly -Likely in the setting of COVID disease and possibly other etiology such as pancreatitis and trending down -Patient's AST peaked to 164 and ALT peaked to 148 and both of trended down; now AST is 89 and ALT is now 107 -T bili was 6.2 is now trending down to  2.9 -Right upper quadrant ultrasound showed "Sludge in the gallbladder. Negative for acute cholecystitis or biliary dilatation. Echogenic right kidney with hydronephrosis -OB/GYN does not feel that this is secondary to preeclampsia due to normotensive blood pressures -Lipase was elevated at 177 repeat was trending down now is 143 -Continue to monitor trend as an outpatient -Repeat CMP on Monday when she follows up with OB/GYN  Normocytic Anemia -Patient's hemoglobin/hematocrit had a dramatic drop from 15.8/47.6 and went to 9.1/27.3 -Likely in the setting of dilutional drop -Check anemia panel  on iron level showed 126, U IBC is 137, TIBC was 263, saturation ratios were 48%, ferritin level was 357, folate level was 16.5, vitamin B12 was 35.9; repeat ferritin level is now 294 -Check FOBT and patient states that stool sample was collected -Continue to monitor for signs and symptoms of bleeding; currently no overt bleeding noted -May need some IV iron and will also defer further anemia work-up to OB/GYN; OB/GYN says that they may add iron supplements at home and will defer to them to start -Repeat CBC on Monday at OB/GYN office  Metabolic Acidosis with Compensated Respiratory Alkalosis -Likely in the setting of pregnancy -Continue with IV fluids per Nephrology recs at 100 mL's per hour -Patient's CO2 is now 16, chloride levels  115, and anion gap is 8 -IV fluid hydration is now stopped and nephrology recommends discharge and follow-up repeat blood work on Monday  COVID-19 Infection with associated viral pneumonia, improving Elevated D-dimer, improving -Patient had a mild cough otherwise she was asymptomatic; there were infiltrates seen on chest x-ray but she is afebrile and has no hypoxia -CXR 11/06/2018 showed "Hazy opacities in the periphery of both lungs slightly greater on RIGHT question atypical pneumonia, minimally more prominent on LEFT than on prior study." -Repeat COVID screening negative; she tested positive on August 8; Per OB-GYN discussion with Dr. Megan Salon of ID, even if her COVID repeat test was negative she is still considered infectious needs to maintain isolation given her mild symptoms; will discuss with infection prevention about her negative cover testing -CRP is trending downward from 3.4 and is now  less than 0.8 -Triglycerides is also trending down from 345 -> 215 -> 251 -CK total was 48 and repeat was 56 today -LDH is trending down as well over from 334 is now 280 -Procalcitonin level of 0.75 and 0.77 -Lactic acid level was normal and was 1.1 -ESR was 124 and  repeat has trended down to 85 -D-dimer is relatively stable and went from 5.95 -> 5.50 -> 6.52 -Fibrinogen is trending down from greater than 800 and is now 707 -Continue supportive care and continue to monitor inflammatory markers as an outpatient as they have stabilized and CRP is improved -Continue Tussionex as needed at discharge -Home COVID isolation guidelines given -Repeat COVID testing was negative and patient feels better.  She has a minimal cough and denies any shortness of breath now.  She was told if her symptoms worsen she needs reevaluation -Currently patient has minimal respiratory symptoms and not hypoxic at all.  Pregnancy in the third trimester -Patient is [redacted] weeks pregnant next-she is evaluated by OB/GYN as well as maternal-fetal medicine over the telephone -OB-GYN and MFM feel there is no indication for delivery or corticosteroids for fetal lung maturity per MFM -They are recommending continuing NSTs daily from OB rapid response note -They will be to be checking a GBS next week -Per maternal-fetal medicine BPP's or ultrasound not needed at this time as last ultrasound fetus showing is appropriate going next-ultimate goal is for a NSVD for patient and only C-section for obstetric or maternal deterioration -I spoke with Dr. 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  Discharge Instructions Discharge Instructions    Call MD for:  difficulty breathing, headache or visual disturbances   Complete by: As directed    Call MD for:  extreme fatigue   Complete by: As directed    Call MD for:  hives   Complete by: As directed    Call MD for:  persistant dizziness or light-headedness   Complete by: As directed    Call MD for:  persistant nausea and vomiting   Complete by: As directed    Call MD  for:  redness, tenderness, or signs of infection (pain, swelling, redness, odor or green/yellow discharge around incision site)   Complete by: As directed    Call MD for:  severe uncontrolled pain   Complete by: As directed    Call MD for:  temperature >100.4   Complete by: As directed    Diet - low sodium heart healthy   Complete by: As directed    Discharge instructions   Complete by: As directed    You were cared for by a hospitalist during your hospital stay. If you have any questions about your discharge medications or the care you received while you were in the hospital after you are discharged, you can call the unit and ask to speak with the hospitalist on call if the hospitalist that took care of you is not available. Once you are discharged, your primary care physician will handle any further medical issues. Please note that NO REFILLS for any discharge medications will be authorized once you are discharged, as it is imperative that you return to your primary care physician (or establish a relationship with a primary care physician if you do not have one) for your aftercare needs so that they  can reassess your need for medications and monitor your lab values.  Follow up with PCP, OB-GYN, and Nephrology as an outpateint. Please repeat Bloodwork on Monday 11/11/2018 and call results about renal function to Dr. Justin Mend. Take all medications as prescribed. If symptoms change or worsen please return to the ED for evaluation   Infection Prevention Recommendations for Individuals Confirmed to have, or Being Evaluated for, 2019 Novel Coronavirus (COVID-19) Infection Who Receive Care at Home  Individuals who are confirmed to have, or are being evaluated for, COVID-19 should follow the prevention steps below until a healthcare provider or local or state health department says they can return to normal activities.  Stay home except to get medical care You should restrict activities outside your home,  except for getting medical care. Do not go to work, school, or public areas, and do not use public transportation or taxis.  Call ahead before visiting your doctor Before your medical appointment, call the healthcare provider and tell them that you have, or are being evaluated for, COVID-19 infection. This will help the healthcare provider's office take steps to keep other people from getting infected. Ask your healthcare provider to call the local or state health department.  Monitor your symptoms Seek prompt medical attention if your illness is worsening (e.g., difficulty breathing). Before going to your medical appointment, call the healthcare provider and tell them that you have, or are being evaluated for, COVID-19 infection. Ask your healthcare provider to call the local or state health department.  Wear a facemask You should wear a facemask that covers your nose and mouth when you are in the same room with other people and when you visit a healthcare provider. People who live with or visit you should also wear a facemask while they are in the same room with you.  Separate yourself from other people in your home As much as possible, you should stay in a different room from other people in your home. Also, you should use a separate bathroom, if available.  Avoid sharing household items You should not share dishes, drinking glasses, cups, eating utensils, towels, bedding, or other items with other people in your home. After using these items, you should wash them thoroughly with soap and water.  Cover your coughs and sneezes Cover your mouth and nose with a tissue when you cough or sneeze, or you can cough or sneeze into your sleeve. Throw used tissues in a lined trash can, and immediately wash your hands with soap and water for at least 20 seconds or use an alcohol-based hand rub.  Wash your [facility] your hands often and thoroughly with soap and water for at least 20 seconds.  You can use an alcohol-based hand sanitizer if soap and water are not available and if your hands are not visibly dirty. Avoid touching your eyes, nose, and mouth with unwashed hands.   Prevention Steps for Caregivers and Household Members of Individuals Confirmed to have, or Being Evaluated for, COVID-19 Infection Being Cared for in the Home  If you live with, or provide care at home for, a person confirmed to have, or being evaluated for, COVID-19 infection please follow these guidelines to prevent infection:  Follow healthcare provider's instructions Make sure that you understand and can help the patient follow any healthcare provider instructions for all care.  Provide for the patient's basic needs You should help the patient with basic needs in the home and provide support for getting groceries, prescriptions, and other personal  needs.  Monitor the patient's symptoms If they are getting sicker, call his or her medical provider and tell them that the patient has, or is being evaluated for, COVID-19 infection. This will help the healthcare provider's office take steps to keep other people from getting infected. Ask the healthcare provider to call the local or state health department.  Limit the number of people who have contact with the patient If possible, have only one caregiver for the patient. Other household members should stay in another home or place of residence. If this is not possible, they should stay in another room, or be separated from the patient as much as possible. Use a separate bathroom, if available. Restrict visitors who do not have an essential need to be in the home.  Keep older adults, very young children, and other sick people away from the patient Keep older adults, very young children, and those who have compromised immune systems or chronic health conditions away from the patient. This includes people with chronic heart, lung, or kidney conditions,  diabetes, and cancer.  Ensure good ventilation Make sure that shared spaces in the home have good air flow, such as from an air conditioner or an opened window, weather permitting.  Wash your hands often Wash your hands often and thoroughly with soap and water for at least 20 seconds. You can use an alcohol based hand sanitizer if soap and water are not available and if your hands are not visibly dirty. Avoid touching your eyes, nose, and mouth with unwashed hands. Use disposable paper towels to dry your hands. If not available, use dedicated cloth towels and replace them when they become wet.  Wear a facemask and gloves Wear a disposable facemask at all times in the room and gloves when you touch or have contact with the patient's blood, body fluids, and/or secretions or excretions, such as sweat, saliva, sputum, nasal mucus, vomit, urine, or feces.  Ensure the mask fits over your nose and mouth tightly, and do not touch it during use. Throw out disposable facemasks and gloves after using them. Do not reuse. Wash your hands immediately after removing your facemask and gloves. If your personal clothing becomes contaminated, carefully remove clothing and launder. Wash your hands after handling contaminated clothing. Place all used disposable facemasks, gloves, and other waste in a lined container before disposing them with other household waste. Remove gloves and wash your hands immediately after handling these items.  Do not share dishes, glasses, or other household items with the patient Avoid sharing household items. You should not share dishes, drinking glasses, cups, eating utensils, towels, bedding, or other items with a patient who is confirmed to have, or being evaluated for, COVID-19 infection. After the person uses these items, you should wash them thoroughly with soap and water.  Wash laundry thoroughly Immediately remove and wash clothes or bedding that have blood, body fluids,  and/or secretions or excretions, such as sweat, saliva, sputum, nasal mucus, vomit, urine, or feces, on them. Wear gloves when handling laundry from the patient. Read and follow directions on labels of laundry or clothing items and detergent. In general, wash and dry with the warmest temperatures recommended on the label.  Clean all areas the individual has used often Clean all touchable surfaces, such as counters, tabletops, doorknobs, bathroom fixtures, toilets, phones, keyboards, tablets, and bedside tables, every day. Also, clean any surfaces that may have blood, body fluids, and/or secretions or excretions on them. Wear gloves when cleaning surfaces the  patient has come in contact with. Use a diluted bleach solution (e.g., dilute bleach with 1 part bleach and 10 parts water) or a household disinfectant with a label that says EPA-registered for coronaviruses. To make a bleach solution at home, add 1 tablespoon of bleach to 1 quart (4 cups) of water. For a larger supply, add  cup of bleach to 1 gallon (16 cups) of water. Read labels of cleaning products and follow recommendations provided on product labels. Labels contain instructions for safe and effective use of the cleaning product including precautions you should take when applying the product, such as wearing gloves or eye protection and making sure you have good ventilation during use of the product. Remove gloves and wash hands immediately after cleaning.  Monitor yourself for signs and symptoms of illness Caregivers and household members are considered close contacts, should monitor their health, and will be asked to limit movement outside of the home to the extent possible. Follow the monitoring steps for close contacts listed on the symptom monitoring form.   ? If you have additional questions, contact your local health department or call the epidemiologist on call at [phone] (available 24/7). ? This guidance is subject to  change. For the most up-to-date guidance from [facility], please refer to their website: YouBlogs.pl   Increase activity slowly   Complete by: As directed    MyChart COVID-19 home monitoring program   Complete by: Nov 08, 2018    Is the patient willing to use the [place] for home monitoring?: Yes   Temperature monitoring   Complete by: Nov 08, 2018    After how many days would you like to receive a notification of this patient's flowsheet entries?: 1     Allergies as of 11/08/2018      Reactions   Contrast Media [iodinated Diagnostic Agents] Hives      Medication List    STOP taking these medications   acetaminophen 500 MG tablet Commonly known as: TYLENOL     TAKE these medications   ascorbic acid 500 MG tablet Commonly known as: VITAMIN C Take 1 tablet (500 mg total) by mouth daily. Start taking on: November 09, 2018   chlorpheniramine-HYDROcodone 10-8 MG/5ML Suer Commonly known as: TUSSIONEX Take 5 mLs by mouth every 12 (twelve) hours as needed for cough.   famotidine 40 MG tablet Commonly known as: PEPCID Take 1 tablet (40 mg total) by mouth every evening.   inFLIXimab 100 MG injection Commonly known as: REMICADE Inject 100 mg into the vein every 6 (six) weeks.   ondansetron 4 MG disintegrating tablet Commonly known as: ZOFRAN-ODT Take 4 mg by mouth every 8 (eight) hours as needed for nausea or vomiting.   prenatal multivitamin Tabs tablet Take 1 tablet by mouth daily at 12 noon.   SYSTANE FREE OP Place 1 drop into both eyes daily as needed (For dry eyes).   zinc sulfate 220 (50 Zn) MG capsule Take 1 capsule (220 mg total) by mouth daily. Start taking on: November 09, 2018       Allergies  Allergen Reactions  . Contrast Media [Iodinated Diagnostic Agents] Hives   Consultations:  OB-GYN  Nephrology  Maternal Fetal Medicine  Procedures/Studies: US Renal  Result Date:  2018-11-07 CLINICAL DATA:  Third trimester pregnancy with diabetes and acute renal failure. EXAM: RENAL / URINARY TRACT ULTRASOUND COMPLETE COMPARISON:  CT abdomen 06/30/2008 FINDINGS: Right Kidney: Renal measurements: 13 x 4.5 x 5.1 cm = volume: 158 mL. Increased echogenicity  of the renal parenchyma. Mild fullness of the renal collecting system, often seen in third trimester pregnancy. Left Kidney: The left kidney was not identified by the technologist. Based on previous CT, this was low lying in the inferior left retroperitoneum and may have been inapparent based on the pregnancy. Bladder: Bilateral ureteral jets are visible. IMPRESSION: Right kidney is echogenic suggesting renal parenchymal disease. Fullness of the renal collecting system as is usually seen in third trimester pregnancy. Ureteral jet argues against true obstruction. The left kidney is not visualized by the technologist. 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 Left ureteral jet suggests that there is a functioning left kidney present, but not seen by sonography. Electronically Signed   By: Nelson Chimes M.D.   On: 11/05/2018 15:29   Dg Chest Port 1 View  Result Date: 11/06/2018 CLINICAL DATA:  Onset of shortness of breath 2 day, history diabetes mellitus, Crohn's disease EXAM: PORTABLE CHEST 1 VIEW COMPARISON:  Portable exam 0933 hours compared to 11/04/2018 FINDINGS: Normal heart size, mediastinal contours, and pulmonary vascularity. Hazy opacities in the periphery of both lungs slightly greater on RIGHT, question atypical infiltrate. Remaining lungs clear. No pleural effusion or pneumothorax. Osseous structures unremarkable. IMPRESSION: Hazy opacities in the periphery of both lungs slightly greater on RIGHT question atypical pneumonia, minimally more prominent on LEFT than on prior study. Electronically Signed   By: Lavonia Dana M.D.   On: 11/06/2018 10:55   Dg Chest Port 1 View  Result Date:  11/04/2018 CLINICAL DATA:  COVID-19 pneumonia.  Dyspnea.  Cough. EXAM: PORTABLE CHEST 1 VIEW COMPARISON:  10/22/2008 chest radiograph. FINDINGS: Stable cardiomediastinal silhouette with normal heart size. No pneumothorax. No pleural effusion. Faint patchy opacities in the peripheral lungs bilaterally. No pulmonary edema. IMPRESSION: Faint patchy opacities in the peripheral lungs bilaterally, compatible with atypical pneumonia. Electronically Signed   By: Ilona Sorrel M.D.   On: 11/04/2018 16:53   US Abdomen Limited Ruq  Result Date: 11/05/2018 CLINICAL DATA:  Thirty-five weeks pregnant, elevated bilirubin EXAM: ULTRASOUND ABDOMEN LIMITED RIGHT UPPER QUADRANT COMPARISON:  None. FINDINGS: Gallbladder: Moderate sludge in the gallbladder. Normal wall thickness. Negative sonographic Murphy. Common bile duct: Diameter: 3.3 mm Liver: No focal lesion identified. Within normal limits in parenchymal echogenicity. Portal vein is patent on color Doppler imaging with normal direction of blood flow towards the liver. Other: Echogenic right kidney with hydronephrosis IMPRESSION: 1. Sludge in the gallbladder. Negative for acute cholecystitis or biliary dilatation 2. Echogenic right kidney with hydronephrosis Electronically Signed   By: Donavan Foil M.D.   On: 11/05/2018 22:17    Subjective: Seen and examined at bedside and she was doing much better.  States that she vomited earlier this morning but was fine down tolerated food without issues.  Thinks that the vomiting is secondary to her pregnancy.  No chest pain, lightheadedness or dizziness.  Minimally coughing and shortness of breath is improved.  States she feels much better.  No other concerns or complaints at this time and ready to be discharged home and will follow up with PCP, OB/GYN.   Discharge Exam: Vitals:   11/08/18 0302 11/08/18 0848  BP: 97/61 102/65  Pulse: 88 97  Resp:  18  Temp: 98.2 F (36.8 C) 98.2 F (36.8 C)  SpO2: 100% 99%   Vitals:    11/07/18 0800 11/07/18 1946 11/08/18 0302 11/08/18 0848  BP: 100/66 123/81 97/61 102/65  Pulse: 85 95 88 97  Resp:  20  18  Temp: 97.9 F (36.6 C) 98.3 F (36.8 C) 98.2 F (36.8 C) 98.2 F (36.8 C)  TempSrc: Oral Oral Oral Oral  SpO2: 99% 100% 100% 99%  Weight:      Height:       General: Pt is alert, awake, not in acute distress Cardiovascular: RRR, S1/S2 +, no rubs, no gallops Respiratory: Diminished bilaterally, no wheezing, no rhonchi; unlabored breathing Abdominal: Soft, NT, distended, bowel sounds + Extremities: no edema, no cyanosis  The results of significant diagnostics from this hospitalization (including imaging, microbiology, ancillary and laboratory) are listed below for reference.    Microbiology: Recent Results (from the past 240 hour(s))  Culture, blood (Routine X 2) w Reflex to ID Panel     Status: None (Preliminary result)   Collection Time: 11/05/18  5:21 AM   Specimen: BLOOD LEFT ARM  Result Value Ref Range Status   Specimen Description BLOOD LEFT ARM  Final   Special Requests   Final    BOTTLES DRAWN AEROBIC ONLY Blood Culture adequate volume   Culture   Final    NO GROWTH 3 DAYS Performed at [facility], [address], [place], [place] [address]    Report Status PENDING  Incomplete  Culture, blood (Routine X 2) w Reflex to ID Panel     Status: None (Preliminary result)   Collection Time: 11/05/18  6:05 AM   Specimen: BLOOD LEFT ARM  Result Value Ref Range Status   Specimen Description BLOOD LEFT ARM  Final   Special Requests   Final    BOTTLES DRAWN AEROBIC ONLY Blood Culture adequate volume   Culture   Final    NO GROWTH 3 DAYS Performed at [facility], [place] [address] [place], [place] [address]    Report Status PENDING  Incomplete  Novel Coronavirus, NAA (Hosp order, Send-out to Ref Lab; TAT 18-24 hrs     Status: None   Collection Time: 11/06/18  9:50 AM  Result Value Ref Range Status   SARS-CoV-2, NAA NOT DETECTED NOT DETECTED  Final    Comment: (NOTE) This test was developed and its performance characteristics determined by [employer]. This test has not been FDA cleared or approved. This test has been authorized by FDA under an Emergency Use Authorization (EUA). This test is only authorized for the duration of time the declaration that circumstances exist justifying the authorization of the emergency use of in vitro diagnostic tests for detection of SARS-CoV-2 virus and/or diagnosis of COVID-19 infection under section 564(b)(1) of the Act, 21 U.S.C. 702OVZ-8(H)(8), unless the authorization is terminated or revoked sooner. When diagnostic testing is negative, the possibility of a false negative result should be considered in the context of a patient's recent exposures and the presence of clinical signs and symptoms consistent with COVID-19. An individual without symptoms of COVID-19 and who is not shedding SARS-CoV-2 virus would expect to have a negative (not detected) result in this assay. Performed  At: [facility] [address] [place], [place] [id number] Rush Farmer MD IN:[id number]    [place]  Final    Comment: Performed at [facility], [place] [address], [place], [place] [address]  Urine Culture     Status: Abnormal   Collection Time: 11/06/18 10:45 PM   Specimen: Urine, Random  Result Value Ref Range Status   Specimen Description URINE, RANDOM  Final   Special Requests   Final    NONE Performed at  [facility], [place] [address], [place],  [address]    Culture MULTIPLE SPECIES PRESENT, SUGGEST RECOLLECTION (A)  Final   Report Status 11/07/2018 FINAL  Final    Labs: BNP (last 3 results) No results for input(s): BNP in the last 8760 hours. Basic Metabolic Panel: Recent Labs  Lab 11/04/18 1436 11/05/18 0521 11/06/18 0550 11/07/18 0507 11/08/18 0558  NA 135 136 137 138 139  K 3.7 4.0 3.7 3.7 3.7  CL 105 109 114* 116* 115*   CO2 16* 14* 15* 15* 16*  GLUCOSE 91 67* 71 75 70  BUN 24* 25* 22* 18 12  CREATININE 2.75* 2.72* 2.55* 2.06* 1.77*  CALCIUM 9.1 8.7* 8.2* 8.1* 8.0*  MG  --  2.1 2.1 1.7 1.4*  PHOS  --  4.0 3.2 2.6 3.0   Liver Function Tests: Recent Labs  Lab 11/04/18 1436 11/05/18 0521 11/06/18 0550 11/07/18 0507 11/08/18 0558  AST 158* 164* 112* 90* 89*  ALT 132* 148* 112* 103* 107*  ALKPHOS 152* 131* 113 106 104  BILITOT 6.2* 6.0* 4.8* 3.9* 2.9*  PROT 8.4* 7.1 5.9* 5.6* 5.4*  ALBUMIN 2.3* 2.0* 1.6* 1.5* 1.6*   Recent Labs  Lab 11/05/18 0521 11/07/18 0507  LIPASE 177* 143*   No results for input(s): AMMONIA in the last 168 hours. CBC: Recent Labs  Lab 11/04/18 1436 11/05/18 0605 11/06/18 0550 11/07/18 0507 11/08/18 0558  WBC 7.6 4.5 6.0 5.5 5.9  NEUTROABS 4.6 2.8 3.1 2.9 3.2  HGB 13.1 15.8* 9.2* 9.1* 9.1*  HCT 39.7 47.6* 27.9* 27.2* 27.3*  MCV 91.3 88.5 90.6 89.8 90.1  PLT 427* 250 293 289 235   Cardiac Enzymes: Recent Labs  Lab 11/05/18 0521 11/06/18 0550 11/07/18 0507 11/08/18 0558  CKTOTAL 55 48 46 56   BNP: Invalid input(s): POCBNP CBG: No results for input(s): GLUCAP in the last 168 hours. D-Dimer Recent Labs    11/07/18 0507 11/08/18 0558  DDIMER 5.50* 6.52*   Hgb A1c No results for input(s): HGBA1C in the last 72 hours. Lipid Profile Recent Labs    11/07/18 0507 11/08/18 0558  TRIG 215* 251*   Thyroid function studies No results for input(s): TSH, T4TOTAL, T3FREE, THYROIDAB in the last 72 hours.  Invalid input(s): FREET3 Anemia work up Recent Labs    11/06/18 0921 11/07/18 0507 11/08/18 0558  VITAMINB12 359  --   --   FOLATE 16.5  --   --   FERRITIN 357* 294 255  TIBC 263  --   --   IRON 126  --   --   RETICCTPCT 1.2  --   --    Urinalysis    Component Value Date/Time   COLORURINE AMBER (A) 11/05/2018 1314   APPEARANCEUR CLOUDY (A) 11/05/2018 1314   LABSPEC 1.013 11/05/2018 1314   PHURINE 5.0 11/05/2018 1314   GLUCOSEU NEGATIVE  11/05/2018 1314   HGBUR MODERATE (A) 11/05/2018 1314   BILIRUBINUR NEGATIVE 11/05/2018 1314   KETONESUR 20 (A) 11/05/2018 1314   PROTEINUR 100 (A) 11/05/2018 1314   UROBILINOGEN 0.2 06/30/2008 1016   NITRITE NEGATIVE 11/05/2018 1314   LEUKOCYTESUR TRACE (A) 11/05/2018 1314   Sepsis Labs Invalid input(s): PROCALCITONIN,  WBC,  LACTICIDVEN Microbiology Recent Results (from the past 240 hour(s))  Culture, blood (Routine X 2) w Reflex to ID Panel     Status: None (Preliminary result)   Collection Time: 11/05/18  5:21 AM   Specimen: BLOOD LEFT ARM  Result Value Ref Range Status  Specimen Description BLOOD LEFT ARM  Final   Special Requests   Final    BOTTLES DRAWN AEROBIC ONLY Blood Culture adequate volume   Culture   Final    NO GROWTH 3 DAYS Performed at [facility], [address] [place], [place] [address]    Report Status PENDING  Incomplete  Culture, blood (Routine X 2) w Reflex to ID Panel     Status: None (Preliminary result)   Collection Time: 11/05/18  6:05 AM   Specimen: BLOOD LEFT ARM  Result Value Ref Range Status   Specimen Description BLOOD LEFT ARM  Final   Special Requests   Final    BOTTLES DRAWN AEROBIC ONLY Blood Culture adequate volume   Culture   Final    NO GROWTH 3 DAYS Performed at [facility], [place] [address] [place], [place] [address]    Report Status PENDING  Incomplete  Novel Coronavirus, NAA (Hosp order, Send-out to Ref Lab; TAT 18-24 hrs     Status: None   Collection Time: 11/06/18  9:50 AM  Result Value Ref Range Status   SARS-CoV-2, NAA NOT DETECTED NOT DETECTED Final    Comment: (NOTE) This test was developed and its performance characteristics determined by [employer]. This test has not been FDA cleared or approved. This test has been authorized by FDA under an Emergency Use Authorization (EUA). This test is only authorized for the duration of time the declaration that circumstances exist justifying the  authorization of the emergency use of in vitro diagnostic tests for detection of SARS-CoV-2 virus and/or diagnosis of COVID-19 infection under section 564(b)(1) of the Act, 21 U.S.C. 916XIH-0(T)(8), unless the authorization is terminated or revoked sooner. When diagnostic testing is negative, the possibility of a false negative result should be considered in the context of a patient's recent exposures and the presence of clinical signs and symptoms consistent with COVID-19. An individual without symptoms of COVID-19 and who is not shedding SARS-CoV-2 virus would expect to have a negative (not detected) result in this assay. Performed  At: [facility] [address] [place], [place] [id number] Rush Farmer MD ZP:[id number]    [place]  Final    Comment: Performed at [facility], [place] [address] [place], [place] [address]  Urine Culture     Status: Abnormal   Collection Time: 11/06/18 10:45 PM   Specimen: Urine, Random  Result Value Ref Range Status   Specimen Description URINE, RANDOM  Final   Special Requests   Final    NONE Performed at [facility], [place] [address], [place], [place] [address]    Culture MULTIPLE SPECIES PRESENT, SUGGEST RECOLLECTION (A)  Final   Report Status 11/07/2018 FINAL  Final   Time coordinating discharge: 35 minutes  SIGNED:  Kerney Elbe, DO [facility] 11/08/2018, 7:22 PM Pager is on [place]  If 7PM-7AM, please contact night-coverage [email or web address] Password [email or web address]

## 2018-11-10 LAB — CULTURE, BLOOD (ROUTINE X 2)
Culture: NO GROWTH
Culture: NO GROWTH
Special Requests: ADEQUATE
Special Requests: ADEQUATE

## 2018-11-10 LAB — INTERLEUKIN-6, PLASMA: Interleukin-6, Plasma: 5.3 pg/mL (ref 0.0–12.2)

## 2018-11-26 ENCOUNTER — Encounter (HOSPITAL_COMMUNITY): Payer: Self-pay | Admitting: *Deleted

## 2018-11-26 ENCOUNTER — Telehealth (HOSPITAL_COMMUNITY): Payer: Self-pay | Admitting: *Deleted

## 2018-11-26 NOTE — Telephone Encounter (Signed)
Preadmission screen  

## 2018-11-29 ENCOUNTER — Other Ambulatory Visit: Payer: Self-pay | Admitting: Obstetrics and Gynecology

## 2018-12-03 ENCOUNTER — Inpatient Hospital Stay (HOSPITAL_COMMUNITY): Payer: BC Managed Care – PPO

## 2018-12-03 ENCOUNTER — Encounter (HOSPITAL_COMMUNITY): Payer: Self-pay

## 2018-12-03 ENCOUNTER — Other Ambulatory Visit: Payer: Self-pay

## 2018-12-03 ENCOUNTER — Inpatient Hospital Stay (HOSPITAL_COMMUNITY)
Admission: AD | Admit: 2018-12-03 | Discharge: 2018-12-05 | DRG: 806 | Disposition: A | Discharge status: Home / Self Care | Payer: BC Managed Care – PPO | Attending: Obstetrics and Gynecology | Admitting: Obstetrics and Gynecology

## 2018-12-03 ENCOUNTER — Inpatient Hospital Stay (HOSPITAL_COMMUNITY): Payer: BC Managed Care – PPO | Admitting: Anesthesiology

## 2018-12-03 DIAGNOSIS — O9902 Anemia complicating childbirth: Secondary | ICD-10-CM | POA: Diagnosis present

## 2018-12-03 DIAGNOSIS — Z3A39 39 weeks gestation of pregnancy: Secondary | ICD-10-CM | POA: Diagnosis not present

## 2018-12-03 DIAGNOSIS — Z20828 Contact with and (suspected) exposure to other viral communicable diseases: Secondary | ICD-10-CM | POA: Diagnosis present

## 2018-12-03 DIAGNOSIS — D573 Sickle-cell trait: Secondary | ICD-10-CM | POA: Diagnosis present

## 2018-12-03 DIAGNOSIS — K5010 Crohn's disease of large intestine without complications: Secondary | ICD-10-CM | POA: Diagnosis present

## 2018-12-03 DIAGNOSIS — Z8759 Personal history of other complications of pregnancy, childbirth and the puerperium: Secondary | ICD-10-CM

## 2018-12-03 DIAGNOSIS — O9962 Diseases of the digestive system complicating childbirth: Secondary | ICD-10-CM | POA: Diagnosis present

## 2018-12-03 DIAGNOSIS — O403XX0 Polyhydramnios, third trimester, not applicable or unspecified: Principal | ICD-10-CM | POA: Diagnosis present

## 2018-12-03 DIAGNOSIS — K50114 Crohn's disease of large intestine with abscess: Secondary | ICD-10-CM | POA: Diagnosis present

## 2018-12-03 LAB — COMPREHENSIVE METABOLIC PANEL
ALT: 9 U/L (ref 0–44)
AST: 15 U/L (ref 15–41)
Albumin: 2.5 g/dL — ABNORMAL LOW (ref 3.5–5.0)
Alkaline Phosphatase: 114 U/L (ref 38–126)
Anion gap: 10 (ref 5–15)
BUN: 6 mg/dL (ref 6–20)
CO2: 19 mmol/L — ABNORMAL LOW (ref 22–32)
Calcium: 8.8 mg/dL — ABNORMAL LOW (ref 8.9–10.3)
Chloride: 107 mmol/L (ref 98–111)
Creatinine, Ser: 0.78 mg/dL (ref 0.44–1.00)
GFR calc Af Amer: >60 mL/min (ref >60)
GFR calc non Af Amer: >60 mL/min (ref >60)
Glucose, Bld: 82 mg/dL (ref 70–99)
Potassium: 3.8 mmol/L (ref 3.5–5.1)
Sodium: 136 mmol/L (ref 135–145)
Total Bilirubin: 0.9 mg/dL (ref 0.3–1.2)
Total Protein: 6.5 g/dL (ref 6.5–8.1)

## 2018-12-03 LAB — CBC
HCT: 31.8 % — ABNORMAL LOW (ref 36.0–46.0)
Hemoglobin: 10.9 g/dL — ABNORMAL LOW (ref 12.0–15.0)
MCH: 31.6 pg (ref 26.0–34.0)
MCHC: 34.3 g/dL (ref 30.0–36.0)
MCV: 92.2 fL (ref 80.0–100.0)
Platelets: 279 10*3/uL (ref 150–400)
RBC: 3.45 MIL/uL — ABNORMAL LOW (ref 3.87–5.11)
RDW: 14.9 % (ref 11.5–15.5)
WBC: 8.7 10*3/uL (ref 4.0–10.5)
nRBC: 0 % (ref 0.0–0.2)

## 2018-12-03 LAB — TYPE AND SCREEN
ABO/RH(D): B POS
Antibody Screen: NEGATIVE

## 2018-12-03 LAB — SARS CORONAVIRUS 2 (TAT 6-24 HRS): SARS Coronavirus 2: NEGATIVE

## 2018-12-03 LAB — RPR: RPR Ser Ql: NONREACTIVE

## 2018-12-03 MED ORDER — BENZOCAINE-MENTHOL 20-0.5 % EX AERO
1.0000 "application " | INHALATION_SPRAY | CUTANEOUS | Status: DC | PRN
  Administered 2018-12-03: 1 via TOPICAL
  Filled 2018-12-03: qty 56

## 2018-12-03 MED ORDER — SIMETHICONE 80 MG PO CHEW: 80.0000 mg | CHEWABLE_TABLET | ORAL | Status: DC | PRN

## 2018-12-03 MED ORDER — ONDANSETRON HCL 4 MG/2ML IJ SOLN: 4.0000 mg | INTRAMUSCULAR | Status: DC | PRN

## 2018-12-03 MED ORDER — OXYTOCIN 40 UNITS IN NORMAL SALINE INFUSION - SIMPLE MED: 2.5000 [IU]/h | INTRAVENOUS | Status: DC

## 2018-12-03 MED ORDER — OXYTOCIN BOLUS FROM INFUSION
500.0000 mL | Freq: Once | INTRAVENOUS | Status: AC
  Administered 2018-12-03: 17:00:00 500 mL via INTRAVENOUS

## 2018-12-03 MED ORDER — VITAMIN C 500 MG PO TABS
500.0000 mg | ORAL_TABLET | Freq: Every day | ORAL | Status: DC
  Administered 2018-12-03 – 2018-12-05 (×3): 500 mg via ORAL
  Filled 2018-12-03 (×3): qty 1

## 2018-12-03 MED ORDER — DIPHENHYDRAMINE HCL 25 MG PO CAPS: 25.0000 mg | ORAL_CAPSULE | Freq: Four times a day (QID) | ORAL | Status: DC | PRN

## 2018-12-03 MED ORDER — LIDOCAINE HCL (PF) 1 % IJ SOLN: 30.0000 mL | INTRAMUSCULAR | Status: DC | PRN

## 2018-12-03 MED ORDER — EPHEDRINE 5 MG/ML INJ: 10.0000 mg | INTRAVENOUS | Status: DC | PRN

## 2018-12-03 MED ORDER — FENTANYL-BUPIVACAINE-NACL 0.5-0.125-0.9 MG/250ML-% EP SOLN
12.0000 mL/h | EPIDURAL | Status: DC | PRN
  Filled 2018-12-03: qty 250

## 2018-12-03 MED ORDER — PRENATAL MULTIVITAMIN CH: 1.0000 | ORAL_TABLET | Freq: Every day | ORAL | Status: DC

## 2018-12-03 MED ORDER — COCONUT OIL OIL: 1.0000 "application " | TOPICAL_OIL | Status: DC | PRN

## 2018-12-03 MED ORDER — PHENYLEPHRINE 40 MCG/ML (10ML) SYRINGE FOR IV PUSH (FOR BLOOD PRESSURE SUPPORT): 80.0000 ug | PREFILLED_SYRINGE | INTRAVENOUS | Status: DC | PRN

## 2018-12-03 MED ORDER — PRENATAL MULTIVITAMIN CH
1.0000 | ORAL_TABLET | Freq: Every day | ORAL | Status: DC
  Administered 2018-12-04 – 2018-12-05 (×2): 1 via ORAL
  Filled 2018-12-03 (×2): qty 1

## 2018-12-03 MED ORDER — ONDANSETRON HCL 4 MG/2ML IJ SOLN: 4.0000 mg | Freq: Four times a day (QID) | INTRAMUSCULAR | Status: DC | PRN

## 2018-12-03 MED ORDER — FLEET ENEMA 7-19 GM/118ML RE ENEM: 1.0000 | ENEMA | RECTAL | Status: DC | PRN

## 2018-12-03 MED ORDER — MISOPROSTOL 25 MCG QUARTER TABLET
25.0000 ug | ORAL_TABLET | ORAL | Status: DC | PRN
  Filled 2018-12-03: qty 1

## 2018-12-03 MED ORDER — DIPHENHYDRAMINE HCL 50 MG/ML IJ SOLN: 12.5000 mg | INTRAMUSCULAR | Status: DC | PRN

## 2018-12-03 MED ORDER — MEASLES, MUMPS & RUBELLA VAC IJ SOLR: 0.5000 mL | Freq: Once | INTRAMUSCULAR | Status: DC

## 2018-12-03 MED ORDER — TETANUS-DIPHTH-ACELL PERTUSSIS 5-2.5-18.5 LF-MCG/0.5 IM SUSP: 0.5000 mL | Freq: Once | INTRAMUSCULAR | Status: DC

## 2018-12-03 MED ORDER — SOD CITRATE-CITRIC ACID 500-334 MG/5ML PO SOLN: 30.0000 mL | ORAL | Status: DC | PRN

## 2018-12-03 MED ORDER — SODIUM CHLORIDE (PF) 0.9 % IJ SOLN
INTRAMUSCULAR | Status: DC | PRN
  Administered 2018-12-03: 12 mL/h via EPIDURAL

## 2018-12-03 MED ORDER — SENNOSIDES-DOCUSATE SODIUM 8.6-50 MG PO TABS
2.0000 | ORAL_TABLET | ORAL | Status: DC
  Administered 2018-12-03: 2 via ORAL
  Filled 2018-12-03 (×2): qty 2

## 2018-12-03 MED ORDER — TERBUTALINE SULFATE 1 MG/ML IJ SOLN: 0.2500 mg | Freq: Once | INTRAMUSCULAR | Status: DC | PRN

## 2018-12-03 MED ORDER — ONDANSETRON HCL 4 MG PO TABS: 4.0000 mg | ORAL_TABLET | ORAL | Status: DC | PRN

## 2018-12-03 MED ORDER — LACTATED RINGERS IV SOLN: 500.0000 mL | INTRAVENOUS | Status: DC | PRN

## 2018-12-03 MED ORDER — ACETAMINOPHEN 325 MG PO TABS: 650.0000 mg | ORAL_TABLET | ORAL | Status: DC | PRN

## 2018-12-03 MED ORDER — ZOLPIDEM TARTRATE 5 MG PO TABS: 5.0000 mg | ORAL_TABLET | Freq: Every evening | ORAL | Status: DC | PRN

## 2018-12-03 MED ORDER — DIBUCAINE (PERIANAL) 1 % EX OINT: 1.0000 "application " | TOPICAL_OINTMENT | CUTANEOUS | Status: DC | PRN

## 2018-12-03 MED ORDER — WITCH HAZEL-GLYCERIN EX PADS: 1.0000 "application " | MEDICATED_PAD | CUTANEOUS | Status: DC | PRN

## 2018-12-03 MED ORDER — LACTATED RINGERS IV SOLN: 500.0000 mL | Freq: Once | INTRAVENOUS | Status: DC

## 2018-12-03 MED ORDER — ZINC SULFATE 220 (50 ZN) MG PO CAPS
220.0000 mg | ORAL_CAPSULE | Freq: Every day | ORAL | Status: DC
  Administered 2018-12-03 – 2018-12-05 (×3): 220 mg via ORAL
  Filled 2018-12-03 (×3): qty 1

## 2018-12-03 MED ORDER — PHENYLEPHRINE 40 MCG/ML (10ML) SYRINGE FOR IV PUSH (FOR BLOOD PRESSURE SUPPORT)
80.0000 ug | PREFILLED_SYRINGE | INTRAVENOUS | Status: DC | PRN
  Filled 2018-12-03: qty 10

## 2018-12-03 MED ORDER — LACTATED RINGERS IV SOLN
INTRAVENOUS | Status: DC
  Administered 2018-12-03: 01:00:00 via INTRAVENOUS

## 2018-12-03 MED ORDER — OXYTOCIN 40 UNITS IN NORMAL SALINE INFUSION - SIMPLE MED
1.0000 m[IU]/min | INTRAVENOUS | Status: DC
  Administered 2018-12-03: 1 m[IU]/min via INTRAVENOUS
  Filled 2018-12-03: qty 1000

## 2018-12-03 MED ORDER — FENTANYL CITRATE (PF) 100 MCG/2ML IJ SOLN: 50.0000 ug | INTRAMUSCULAR | Status: DC | PRN

## 2018-12-03 MED ORDER — ACETAMINOPHEN 325 MG PO TABS
650.0000 mg | ORAL_TABLET | ORAL | Status: DC | PRN
  Administered 2018-12-05: 13:00:00 650 mg via ORAL
  Filled 2018-12-03: qty 2

## 2018-12-03 MED ORDER — FERROUS SULFATE 325 (65 FE) MG PO TABS
325.0000 mg | ORAL_TABLET | Freq: Two times a day (BID) | ORAL | Status: DC
  Administered 2018-12-04 (×2): 325 mg via ORAL
  Filled 2018-12-03 (×3): qty 1

## 2018-12-03 MED ORDER — IBUPROFEN 600 MG PO TABS
600.0000 mg | ORAL_TABLET | Freq: Four times a day (QID) | ORAL | Status: DC
  Administered 2018-12-03 – 2018-12-04 (×4): 600 mg via ORAL
  Filled 2018-12-03 (×6): qty 1

## 2018-12-03 MED ORDER — LIDOCAINE-EPINEPHRINE (PF) 2 %-1:200000 IJ SOLN
INTRAMUSCULAR | Status: DC | PRN
  Administered 2018-12-03 (×2): 2 mL via EPIDURAL

## 2018-12-03 NOTE — Lactation Note (Signed)
This note was copied from a baby's chart. Lactation Consultation Note Baby 6 hrs old. Mom stated baby hasn't BF. Has been sleepy w/no interest in BF. Didn't latch after delivery. Mom has 34 yr old that she BF for 1 yr. Mom has 7 yr old that she didn't BF. Mom holding baby STS. Praised mom. Mom's breast are heavy, short shaft everted nipples, everts well w/stimulation. Encouraged mom to stimulate to evert before latching.  Hand expressed 10 ml colostrum easily. Colostrum poured from breast. W/gloved finger massaged gums and tongue to stimulate suckling w/colostrum. Placed baby in cradle position. Baby cried, wouldn't suckle on breast. Sat baby upright, giving colostrum w/gloved finger, stimulating suckling. Spoon fed as baby suckled.placed baby to the breast in football position. Positioning, support, safety, STS, I&O, newborn feeding habits, breast massage, spoon feeding, and newborn behavior. Noted labial frenulum. Baby extends tongue past gums.  Baby suckled well after sandwiching breast tissue and everted nipple deep into mouth. Baby was tongue thrusting at first then suckled well. Mom denied pain. Mom encouraged to feed baby 8-12 times/24 hours and with feeding cues. Mom encouraged to waken baby for feeds if hasn't cued in 3 hrs. Lactation brochure given.    Patient Name: Marissa Adams HALPF'X Date: 12/03/2018 Reason for consult: Initial assessment;Term   Maternal Data Has patient been taught Hand Expression?: Yes Does the patient have breastfeeding experience prior to this delivery?: Yes  Feeding Feeding Type: Breast Milk  LATCH Score Latch: Repeated attempts needed to sustain latch, nipple held in mouth throughout feeding, stimulation needed to elicit sucking reflex.  Audible Swallowing: A few with stimulation  Type of Nipple: Everted at rest and after stimulation  Comfort (Breast/Nipple): Soft / non-tender  Hold (Positioning): Assistance needed to correctly position  infant at breast and maintain latch.  LATCH Score: 7  Interventions Interventions: Breast feeding basics reviewed;Adjust position;Assisted with latch;Support pillows;Skin to skin;Position options;Breast massage;Expressed milk;Hand express;Breast compression  Lactation Tools Discussed/Used     Consult Status Consult Status: Follow-up Date: 12/04/18 Follow-up type: In-patient    Theodoro Kalata 12/03/2018, 11:26 PM

## 2018-12-03 NOTE — Progress Notes (Signed)
Marissa Adams is a 33 y.o. [phone] at 56w0dadmitted for induction of labor due to Hydramnios with AFI 37  Subjective:  Comfortable with  Labor support without medications Contractions every 1-3 minutes, lasting 40 seconds, intensity 4-5/10    Objective: BP 109/66   Pulse 94   Temp 98 F (36.7 C) (Oral)   Resp 15   Ht 5\' 4"  (1.626 m)   Wt 79.4 kg   LMP 03/05/2018   BMI 30.06 kg/m  No intake/output data recorded. No intake/output data recorded.  FHT:  Category 1 SVE:   Dilation: 4.5 Effacement (%): 70 Station: -3 Bedside ultrasound: no cord near cervix Needling of membranes after informed consent and OR in standby: abundant clear fluid. Baby dropped to -2 Exam by:: Dr Cletis Media  Labs: Lab Results  Component Value Date   WBC 8.7 12/03/2018   HGB 10.9 (L) 12/03/2018   HCT 31.8 (L) 12/03/2018   MCV 92.2 12/03/2018   PLT 279 12/03/2018    Assessment / Plan: Induction of labor due to polyhydramnios Fetal Wellbeing: reassuring Anticipated MOD:  NSVD    Marissa Adams A Marissa Adams 12/03/2018, 1:14 PM

## 2018-12-03 NOTE — Progress Notes (Signed)
Pt recently arrived for scheduled induction at 39wks d/t polyhydramnios No vitals entered yet Tracing reviewed remotely, cat 1 and reactive Contracting Will discuss with midwife POC once pt is evaluated by Burman Foster, CNM

## 2018-12-03 NOTE — Progress Notes (Signed)
Subjective:   Doing well. Reports Foley expelled while ambulating to BR.   Objective:   VS: BP 108/68   Pulse 88   Temp 98 F (36.7 C) (Oral)   Resp 19   Ht 5\' 4"  (1.626 m)   Wt 79.4 kg   LMP 03/05/2018   BMI 30.06 kg/m  FHR : baseline 135 / variability mod / accelerations present / absent decelerations Toco: contractions every 2-3 minutes, mil-mod per palpation Membranes: Intact Dilation: 4 Effacement (%): 50 Station: Ballotable Presentation: Vertex Exam by:: CNM V Marissa Adams  Assessment /Plan:   Latent labor FHR category 1 Pitocin started     -IOL for polyhydramnios GBS neg Chrohn's disease Perinanal fistula x2 Pain mngt Epidural PRN Anticipate NSVD    Arrie Eastern CNM, MSN 12/03/2018, 6:58 AM

## 2018-12-03 NOTE — Anesthesia Preprocedure Evaluation (Signed)
Anesthesia Evaluation  Patient identified by MRN, date of birth, ID band Patient awake    Reviewed: Allergy & Precautions, NPO status , Patient's Chart, lab work & pertinent test results  Airway Mallampati: II  TM Distance: >3 FB Neck ROM: Full    Dental no notable dental hx.    Pulmonary neg pulmonary ROS,  Previously Covid positive now negative   Pulmonary exam normal breath sounds clear to auscultation       Cardiovascular negative cardio ROS Normal cardiovascular exam Rhythm:Regular Rate:Normal     Neuro/Psych negative neurological ROS  negative psych ROS   GI/Hepatic Neg liver ROS, Crohn's   Endo/Other  negative endocrine ROSdiabetes, Well Controlled  Renal/GU negative Renal ROS  negative genitourinary   Musculoskeletal negative musculoskeletal ROS (+)   Abdominal   Peds  Hematology negative hematology ROS (+)   Anesthesia Other Findings IOL for polyhydraminos  Reproductive/Obstetrics (+) Pregnancy                             Anesthesia Physical Anesthesia Plan  ASA: III  Anesthesia Plan: Epidural   Post-op Pain Management:    Induction:   PONV Risk Score and Plan: Treatment may vary due to age or medical condition  Airway Management Planned: Natural Airway  Additional Equipment:   Intra-op Plan:   Post-operative Plan:   Informed Consent: I have reviewed the patients History and Physical, chart, labs and discussed the procedure including the risks, benefits and alternatives for the proposed anesthesia with the patient or authorized representative who has indicated his/her understanding and acceptance.       Plan Discussed with: Anesthesiologist  Anesthesia Plan Comments: (Patient identified. Risks, benefits, options discussed with patient including but not limited to bleeding, infection, nerve damage, paralysis, failed block, incomplete pain control, headache,  blood pressure changes, nausea, vomiting, reactions to medication, itching, and post partum back pain. Confirmed with bedside nurse the patient's most recent platelet count. Confirmed with the patient that they are not taking any anticoagulation, have any bleeding history or any family history of bleeding disorders. Patient expressed understanding and wishes to proceed. All questions were answered. )        Anesthesia Quick Evaluation

## 2018-12-03 NOTE — Progress Notes (Signed)
EMMYLOU BIEKER is a 33 y.o. [phone] at 38w0dadmitted for induction of labor due to Hydramnios with AFI 37  Subjective:  Comfortable with  Labor support without medications Contractions every 1-5 minutes, lasting 40 seconds, intensity 4/10    Objective: BP 124/63   Pulse 76   Temp 98 F (36.7 C) (Oral)   Resp 15   Ht 5\' 4"  (1.626 m)   Wt 79.4 kg   LMP 03/05/2018   BMI 30.06 kg/m  No intake/output data recorded. No intake/output data recorded.  FHT:  Category 1 SVE:   Dilation: 4 Effacement (%): 50 Station: Ballotable Exam by:: Dr Cletis Media  Labs: Lab Results  Component Value Date   WBC 8.7 12/03/2018   HGB 10.9 (L) 12/03/2018   HCT 31.8 (L) 12/03/2018   MCV 92.2 12/03/2018   PLT 279 12/03/2018    Assessment / Plan: Induction of labor due to polyhydramnios Fetal Wellbeing: reassuring Anticipated MOD:  NSVD    Katharine Look A Odalis Jordan 12/03/2018, 11:14 AM

## 2018-12-03 NOTE — H&P (Addendum)
OB ADMISSION/ HISTORY & PHYSICAL:  Admission Date: 12/03/2018 12:15 AM  Admit Diagnosis: Polyhydramnios  Marissa Adams is a 33 y.o. female presenting for IOL for polyhydramnios. Previous COVID-19 positive and hospitalized w/ renal sequelae. COVID-19 test neg on 8/26. Rapid test results pending from 11/02/18. 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   Prenatal History: [id number]   EDC : 12/10/2018, per LMP C/W 12 wk [place] Prenatal care at [facility]  Prenatal course complicated by  Patient Active Problem List   Diagnosis Date Noted  . Polyhydramnios affecting pregnancy in third trimester 12/03/2018  . AKI (acute kidney injury) ([place]) 11/04/2018  . Pneumonia due to COVID-19 virus 11/04/2018  . Transaminitis 11/04/2018  . Pregnancy and infectious disease in third trimester 11/04/2018  . Crohn's disease of rectum with abscess ([place]) 04/26/2016  . Vaginal delivery 04/26/2016  . Sickle cell trait ([place]) 04/25/2016  . Abscess of buttock, left 04/25/2016  . Perianal abscess 11/18/2015  . Crohn disease ([place]) 12/08/2011     Prenatal Labs: ABO, Rh: --/--/B POS (09/22 0042) Antibody: NEG (09/22 0042) Rubella:  Immune  RPR:  Non-reactive HBsAg: Negative (08/25 0605)  HIV: Non Reactive (08/25 0605)  GTT: Passed GBS:  Negative GC/CHL: Negative Anat US-Complete anatomy, anterior placenta   OB History  Gravida Para Term Preterm AB Living  4 2 2   1 2   SAB TAB Ectopic Multiple Live Births  1     0 2    # Outcome Date GA Lbr Len/2nd Weight Sex Delivery Anes PTL Lv  4 Current           3 Term 04/26/16 [redacted]w[redacted]d 02:07 / 00:50 2960 g M Vag-Spont EPI, Local  LIV     Birth Comments: NONE  2 SAB 2015          1 Term 2012 [redacted]w[redacted]d  3402 g M Vag-Spont Other  LIV    Medical / Surgical History: Past medical history:  Past Medical History:  Diagnosis Date  . Blood transfusion without reported  diagnosis    Crohn's Disease  . Crohn's disease ([place])   . Diabetes mellitus without complication ([place])    drug induced diabetes  . Vaginal Pap smear, abnormal   . Vitamin D deficiency     Past surgical history:  Past Surgical History:  Procedure Laterality Date  . BUNIONECTOMY     L foot   Family History:  Family History  Problem Relation Age of Onset  . Diabetes Mother   . Stroke Paternal Uncle   . Hypertension Maternal Grandmother   . Diabetes Maternal Grandmother   . Hypertension Paternal Grandmother      Social History:  reports that she has never smoked. She has never used smokeless tobacco. She reports current drug use. Drug: Hydrocodone. She reports that she does not drink alcohol.  Allergies: Contrast media [iodinated diagnostic agents]   Current Medications at time of admission:  Prior to Admission medications   Medication Sig Start Date End Date Taking? Authorizing Provider  chlorpheniramine-HYDROcodone (TUSSIONEX) 10-8 MG/5ML SUER Take 5 mLs by mouth every 12 (twelve) hours as needed for cough. 11/08/18  Yes Sheikh, Omair Latif, DO  famotidine (PEPCID) 40 MG tablet Take 1 tablet (40 mg total) by mouth every evening. 10/24/18 10/24/19 Yes Nugent, Gerrie Nordmann, NP  Polyethyl Glycol-Propyl Glycol (SYSTANE FREE OP) Place 1 drop into both eyes daily as needed (For dry  eyes).    Yes [provider]  Prenatal Vit-Fe Fumarate-FA (PRENATAL MULTIVITAMIN) TABS tablet Take 1 tablet by mouth daily at 12 noon.   Yes [provider]  vitamin C (VITAMIN C) 500 MG tablet Take 1 tablet (500 mg total) by mouth daily. 11/09/18  Yes Sheikh, Omair Latif, DO  zinc sulfate 220 (50 Zn) MG capsule Take 1 capsule (220 mg total) by mouth daily. 11/09/18  Yes Sheikh, Omair Latif, DO  inFLIXimab (REMICADE) 100 MG injection Inject 100 mg into the vein every 6 (six) weeks.    [provider]  ondansetron (ZOFRAN-ODT) 4 MG disintegrating tablet Take 4 mg by mouth every 8 (eight)  hours as needed for nausea or vomiting.    [provider]    Review of Systems: ROS Const-Denies fever, chills Skin- Reports perianal fistulas Resp- Denies cough, dyspnea GI- Occasional diarrhea Reports active FM Denies LOF  CBC    Component Value Date/Time   WBC 8.7 12/03/2018 0042   RBC 3.45 (L) 12/03/2018 0042   HGB 10.9 (L) 12/03/2018 0042   HCT 31.8 (L) 12/03/2018 0042   PLT 279 12/03/2018 0042   MCV 92.2 12/03/2018 0042   MCH 31.6 12/03/2018 0042   MCHC 34.3 12/03/2018 0042   RDW 14.9 12/03/2018 0042   LYMPHSABS 1.9 11/08/2018 0558   MONOABS 0.8 11/08/2018 0558   EOSABS 0.1 11/08/2018 0558   BASOSABS 0.0 11/08/2018 0558   .cmp  Physical Exam: VS: Blood pressure 119/75, pulse 100, height 5\' 4"  (1.626 m), weight 79.4 kg, last menstrual period 03/05/2018  alert and oriented, appears states age, no apparent distress heart:RRR lung fields clear with ausculation abdomen soft and non-tender, non-distended  uterus gravid and non-tender, appears stated GA extremities: No edema, calf tenderness, or cords  cervical exam:Dilation: 1 Effacement (%): 20 Station: Ballotable Exam by:: CNM V Navada Osterhout  FHR: baseline rate 135 / variability mod / accelerations present / absent decelerations TOCO: 2-5/mild-mod per palpation  Prenatal Transfer Tool  Maternal Diabetes: No Genetic Screening: Declined Maternal Ultrasounds/Referrals: Normal Fetal Ultrasounds or other Referrals:  None Maternal Substance Abuse:  No Significant Maternal Medications:  Meds include: Other:   No current facility-administered medications on file prior to encounter.    Current Outpatient Medications on File Prior to Encounter  Medication Sig Dispense Refill  . chlorpheniramine-HYDROcodone (TUSSIONEX) 10-8 MG/5ML SUER Take 5 mLs by mouth every 12 (twelve) hours as needed for cough. 140 mL 0  . famotidine (PEPCID) 40 MG tablet Take 1 tablet (40 mg total) by mouth every evening. 30 tablet 0  .  Polyethyl Glycol-Propyl Glycol (SYSTANE FREE OP) Place 1 drop into both eyes daily as needed (For dry eyes).     . Prenatal Vit-Fe Fumarate-FA (PRENATAL MULTIVITAMIN) TABS tablet Take 1 tablet by mouth daily at 12 noon.    . vitamin C (VITAMIN C) 500 MG tablet Take 1 tablet (500 mg total) by mouth daily. 30 tablet 0  . zinc sulfate 220 (50 Zn) MG capsule Take 1 capsule (220 mg total) by mouth daily. 10 capsule 0  . inFLIXimab (REMICADE) 100 MG injection Inject 100 mg into the vein every 6 (six) weeks.    . ondansetron (ZOFRAN-ODT) 4 MG disintegrating tablet Take 4 mg by mouth every 8 (eight) hours as needed for nausea or vomiting.      Significant Maternal Lab Results: Group B Strep negative  COVID-19 pos on 10/19/18, negative on 8/26, results pending for 9/22.     Assessment: [id number] IOL  for Polyhydramnios     -AFI 31.7 on 9/15 FHR category 1 GBS negative   Plan:  Admit to L&D Routine admission orders    -additional labs- CMP, results pending Mechanical cervical ripening Epidural PRN COVID results pending Dr Su Hilt notified of admission / plan of care  Roma Schanz CNM, MSN 12/03/2018, 2:49 AM

## 2018-12-03 NOTE — Anesthesia Procedure Notes (Signed)
Epidural Patient location during procedure: OB Start time: 12/03/2018 3:35 PM End time: 12/03/2018 3:50 PM  Staffing Anesthesiologist: Freddrick March, MD Performed: anesthesiologist   Preanesthetic Checklist Completed: patient identified, pre-op evaluation, timeout performed, IV checked, risks and benefits discussed and monitors and equipment checked  Epidural Patient position: sitting Prep: site prepped and draped and DuraPrep Patient monitoring: continuous pulse ox, blood pressure, heart rate and cardiac monitor Approach: midline Location: L3-L4 Injection technique: LOR air  Needle:  Needle type: Tuohy  Needle gauge: 17 G Needle length: 9 cm Needle insertion depth: 5 cm Catheter type: closed end flexible Catheter size: 19 Gauge Catheter at skin depth: 10 cm Test dose: negative  Assessment Sensory level: T8 Events: blood not aspirated, injection not painful, no injection resistance, negative IV test and no paresthesia  Additional Notes Patient identified. Risks/Benefits/Options discussed with patient including but not limited to bleeding, infection, nerve damage, paralysis, failed block, incomplete pain control, headache, blood pressure changes, nausea, vomiting, reactions to medication both or allergic, itching and postpartum back pain. Confirmed with bedside nurse the patient's most recent platelet count. Confirmed with patient that they are not currently taking any anticoagulation, have any bleeding history or any family history of bleeding disorders. Patient expressed understanding and wished to proceed. All questions were answered. Sterile technique was used throughout the entire procedure. Please see nursing notes for vital signs. Test dose was given through epidural catheter and negative prior to continuing to dose epidural or start infusion. Warning signs of high block given to the patient including shortness of breath, tingling/numbness in hands, complete motor block,  or any concerning symptoms with instructions to call for help. Patient was given instructions on fall risk and not to get out of bed. All questions and concerns addressed with instructions to call with any issues or inadequate analgesia.  Reason for block:procedure for pain

## 2018-12-04 LAB — CBC
HCT: 32.4 % — ABNORMAL LOW (ref 36.0–46.0)
Hemoglobin: 11 g/dL — ABNORMAL LOW (ref 12.0–15.0)
MCH: 31.1 pg (ref 26.0–34.0)
MCHC: 34 g/dL (ref 30.0–36.0)
MCV: 91.5 fL (ref 80.0–100.0)
Platelets: 247 10*3/uL (ref 150–400)
RBC: 3.54 MIL/uL — ABNORMAL LOW (ref 3.87–5.11)
RDW: 14.8 % (ref 11.5–15.5)
WBC: 10.1 10*3/uL (ref 4.0–10.5)
nRBC: 0 % (ref 0.0–0.2)

## 2018-12-04 NOTE — Anesthesia Postprocedure Evaluation (Signed)
Anesthesia Post Note  Patient: Marissa Adams  Procedure(s) Performed: AN AD HOC LABOR EPIDURAL     Patient location during evaluation: Mother Baby Anesthesia Type: Epidural Level of consciousness: awake and alert and oriented Pain management: satisfactory to patient Vital Signs Assessment: post-procedure vital signs reviewed and stable Respiratory status: respiratory function stable Cardiovascular status: stable Postop Assessment: no headache, no backache, epidural receding, patient able to bend at knees, no signs of nausea or vomiting and adequate PO intake Anesthetic complications: no    Last Vitals:  Vitals:   12/04/18 0345 12/04/18 0740  BP: 124/74 112/75  Pulse: 76 83  Resp: 18 18  Temp: 37.3 C 36.7 C  SpO2:      Last Pain:  Vitals:   12/04/18 0740  TempSrc: Oral  PainSc: 0-No pain   Pain Goal:                   Irby Fails

## 2018-12-04 NOTE — Progress Notes (Signed)
Patient ID: Marissa Adams, female   DOB: 10/02/1985, 33 y.o.   MRN: [id number] PPD # 1 S/P VAVD  Live born female  Birth Weight: 7 lb 7.8 oz (3395 g) APGAR: 8, 9  Newborn Delivery   Birth date/time: 12/03/2018 17:05:00 Delivery type: Vaginal, Vacuum (Extractor)     Baby name: Marissa Adams Delivering provider: Delsa Bern  Episiotomy:None   Lacerations: 2nd degree perineal  Feeding: breast  Pain control at delivery: Epidural   S:  Reports feeling well, tired.             Tolerating po/ No nausea or vomiting             Bleeding is light             Pain controlled with ibuprofen (OTC)             Up ad lib / ambulatory / voiding without difficulties   O:  A & O x 3, in no apparent distress              VS:  Vitals:   12/03/18 1845 12/03/18 1945 12/03/18 2345 12/04/18 0345  BP: (!) 118/55 114/75 134/76 124/74  Pulse: 85 74 82 76  Resp: 18 18 18 18   Temp: 99 F (37.2 C) 99.1 F (37.3 C) 99.4 F (37.4 C) 99.2 F (37.3 C)  TempSrc: Oral Oral Oral Oral  SpO2: 100%     Weight:      Height:        LABS:  Recent Labs    12/03/18 0042  WBC 8.7  HGB 10.9*  HCT 31.8*  PLT 279    Blood type: --/--/B POS (09/22 0042)  Rubella:   immune  I&O: I/O last 3 completed shifts: In: -  Out: 800 [Urine:650; Blood:150]          No intake/output data recorded.  Vaccines: TDaP UTD         Flu    Planning next week at GI specialist office   Lungs: Clear and unlabored  Heart: regular rate and rhythm / no murmurs  Abdomen: soft, non-tender, non-distended             Fundus: firm, non-tender, U-1  Perineum: repair intact  Lochia: small  Extremities: no edema, no calf pain or tenderness    A/P: PPD # 1 33 y.o., [id number]   Principal Problem:   Postpartum care following VAVD 9/22 Active Problems:   Crohn's disease of rectum with abscess ([place])  - stable, fistulas followed by GI specialist, has F/U next week   Polyhydramnios affecting pregnancy in third trimester   Status  post vacuum-assisted vaginal delivery   Doing well - stable status  Routine post partum orders  Anticipate discharge tomorrow  PP birth control - desires permanent sterilization    Juliene Pina, MSN, CNM 12/04/2018, 6:09 AM

## 2018-12-04 NOTE — Lactation Note (Signed)
This note was copied from a baby's chart. Lactation Consultation Note  Patient Name: Marissa Adams AYOKH'T Date: 12/04/2018 Reason for consult: Follow-up assessment Baby is 36 hours old.  Baby is latching well to right breast but not left.  Mom recently hand expressed milk into colostrum container.  Baby is currently sleeping on mom"s chest.  She just tried to latch baby but she was sleepy.  Discussed first 24 hour behavior and cluster feeding on day 2-3.  Encouraged to call for assist with next feeding.  Maternal Data    Feeding    LATCH Score                   Interventions    Lactation Tools Discussed/Used     Consult Status Consult Status: Follow-up Date: 12/04/18 Follow-up type: In-patient    [name] 12/04/2018, 1:14 PM

## 2018-12-04 NOTE — Plan of Care (Signed)
Patient is progress well, she is with little pain, voiding well and will continue to work on breastfeeding today

## 2018-12-04 NOTE — Discharge Summary (Signed)
SVD OB Discharge Summary     Patient Name: Marissa Adams DOB: 02-15-1986 MRN: [id number]  Date of admission: 12/03/2018 Delivering MD: Silverio LayIVARD, SANDRA  Date of delivery: 12/03/18 Type of delivery: SVD with vacuumed assist.   Newborn Data: Sex: Baby Female Live born female  Birth Weight: 7 lb 7.8 oz (3395 g) APGAR: 8, 9  Newborn Delivery   Birth date/time: 12/03/2018 17:05:00 Delivery type: Vaginal, Vacuum (Extractor)      Feeding: breast Infant being discharge to home with mother in stable condition.   Admitting diagnosis: pregnancy Intrauterine pregnancy: 8729w1d     Secondary diagnosis:  Principal Problem:   Postpartum care following VAVD 9/22 Active Problems:   Crohn's disease of rectum with abscess ([facility])   Polyhydramnios affecting pregnancy in third trimester   Status post vacuum-assisted vaginal delivery   Normal postpartum course                                Complications: None                                                              Intrapartum Procedures: vacuum Postpartum Procedures: none Complications-Operative and Postpartum: 2nd degree perineal laceration Augmentation: AROM, Pitocin and Foley Balloon   History of Present Illness: Ms. Marissa BlackbirdLaquetta D Kempfer is a 33 y.o. female, [id number], who presents at 7329w1d weeks gestation. The patient [facility]  [facility]  Her pregnancy has been complicated by:  Patient Active Problem List   Diagnosis Date Noted  . Normal postpartum course 12/04/2018  . Polyhydramnios affecting pregnancy in third trimester 12/03/2018  . Status post vacuum-assisted vaginal delivery 12/03/2018  . Postpartum care following VAVD 9/22 12/03/2018  . AKI (acute kidney injury) ([facility]) 11/04/2018  . Pneumonia due to COVID-19 virus 11/04/2018  . Transaminitis 11/04/2018  . Pregnancy and infectious disease in third trimester 11/04/2018  . Crohn's disease of rectum with abscess ([facility]) 04/26/2016  .  Vaginal delivery 04/26/2016  . Sickle cell trait ([facility]) 04/25/2016  . Abscess of buttock, left 04/25/2016  . Perianal abscess 11/18/2015  . Crohn disease ([facility]) 12/08/2011    Hospital course:  Induction of Labor With Vaginal Delivery   33 y.o. yo [id number] at 1129w1d was admitted to the hospital 12/03/2018 for induction of labor.  Indication for induction: polyhydramnios .  Patient had an uncomplicated labor course as follows: Membrane Rupture Time/Date: 2:00 PM ,12/03/2018   Intrapartum Procedures: Episiotomy: None [1]                                         Lacerations:    Patient had delivery of a Viable infant.  Information for the patient's newborn:  Laural GoldenBelvin, Girl Savayah [[id number]][[id number]]  Delivery Method: Vaginal, Vacuum (Extractor)(Filed from Delivery Summary)    12/03/2018  Details of delivery can be found in separate delivery note.  Patient had a routine postpartum course. Patient is discharged home 12/05/18. Postpartum Day # 2 : S/P NSVD due to IOL for Poly. Patient up ad lib, denies syncope or dizziness. Reports consuming regular diet without issues and denies N/V. Patient  reports 0 bowel movement + passing flatus.  Denies issues with urination and reports bleeding is "lighter."  Patient is breastfeeding and reports going well.  Desires BTL for postpartum contraception.  Pain is being appropriately managed with use of po meds. Pt has h/o chrons and fistula, will continue home meds and has appointment in 1 week with GI. Pt denies abdominal pain, rectal pain or bleeding.     Physical exam  Vitals:   12/03/18 2345 12/04/18 0345 12/04/18 0740 12/04/18 1503  BP: 134/76 124/74 112/75 127/72  Pulse: 82 76 83 88  Resp: 18 18 18 18   Temp: 99.4 F (37.4 C) 99.2 F (37.3 C) 98 F (36.7 C) 98.8 F (37.1 C)  TempSrc: Oral Oral Oral Oral  SpO2:    100%  Weight:      Height:       General: alert, cooperative and no distress Lochia: appropriate Uterine Fundus: firm Perineum: approximate, no  hematomas noted.  DVT Evaluation: No evidence of DVT seen on physical exam. Negative Homan's sign. No cords or calf tenderness. No significant calf/ankle edema.  Labs: Lab Results  Component Value Date   WBC 10.1 12/04/2018   HGB 11.0 (L) 12/04/2018   HCT 32.4 (L) 12/04/2018   MCV 91.5 12/04/2018   PLT 247 12/04/2018   CMP Latest Ref Rng & Units 12/03/2018  Glucose 70 - 99 mg/dL 82  BUN 6 - 20 mg/dL 6  Creatinine 12/05/2018 - 5.70 mg/dL 1.77  Sodium 9.39 - 030 mmol/L 136  Potassium 3.5 - 5.1 mmol/L 3.8  Chloride 98 - 111 mmol/L 107  CO2 22 - 32 mmol/L 19(L)  Calcium 8.9 - 10.3 mg/dL 092)  Total Protein 6.5 - 8.1 g/dL 6.5  Total Bilirubin 0.3 - 1.2 mg/dL 0.9  Alkaline Phos 38 - 126 U/L 114  AST 15 - 41 U/L 15  ALT 0 - 44 U/L 9    Date of discharge: 12/05/2018 Discharge Diagnoses: Term Pregnancy-delivered Discharge instruction: per After Visit Summary and "Baby and Me Booklet".  After visit meds:   Activity:           unrestricted and pelvic rest Advance as tolerated. Pelvic rest for 6 weeks.  Diet:                routine Medications: PNV and Tylenol, avoid NSAIDS due to chrons.  Postpartum contraception: Tubal Ligation Wants PP out pt Condition:  Pt discharge to home with baby in stable Chrons: f/u in one week with GI.   Meds: Allergies as of 12/05/2018      Reactions   Contrast Media [iodinated Diagnostic Agents] Hives      Medication List    STOP taking these medications   chlorpheniramine-HYDROcodone 10-8 MG/5ML Suer Commonly known as: TUSSIONEX     TAKE these medications   ascorbic acid 500 MG tablet Commonly known as: VITAMIN C Take 1 tablet (500 mg total) by mouth daily.   famotidine 40 MG tablet Commonly known as: PEPCID Take 1 tablet (40 mg total) by mouth every evening. What changed: how much to take   inFLIXimab 100 MG injection Commonly known as: REMICADE Inject 100 mg into the vein every 6 (six) weeks.   prenatal multivitamin Tabs tablet Take  1 tablet by mouth daily.   SYSTANE FREE OP Place 1 drop into both eyes daily as needed (For dry eyes).   zinc sulfate 220 (50 Zn) MG capsule Take 1 capsule (220 mg total) by mouth daily.  Discharge Follow Up:  [facility] Follow up in 6 week(s).   Specialty: Obstetrics and Gynecology Contact information: [address] [place] [id number] [place], NP-C, CNM 12/05/2018, 5:07 AM  Noralyn Pick, FNP

## 2018-12-05 NOTE — Lactation Note (Signed)
This note was copied from a baby's chart. Lactation Consultation Note  Patient Name: Marissa Adams MOLMB'E Date: 12/05/2018   Mom called [place] for a latch check. The lactation student assisted Mom with getting a deeper latch. Swallows were verified by cervical auscultation. "[name]" is nursing very well.   Mom's milk is coming to volume. Mom has a pump at home & knows to pump for comfort once her milk has come in, if needed.   [name] [facility] 12/05/2018, 12:17 PM

## 2018-12-05 NOTE — Lactation Note (Signed)
This note was copied from a baby's chart. Lactation Consultation Note  Patient Name: Marissa Adams Today's Date: 12/05/2018  Infant is 29 hrs old & just recently fed at the breast. Infant is sleeping on Mom's chest. Infant has not stooled since birth (terminal mec @ delivery), but Mom feels like infant is feeding well. Mom adjusts baby's latch when she feels like latch is not right.   Mom says she's willing to call us to return for a latch check when infant is ready to feed again.   Mom is on Remicade for Crohn's disease.   [name] [facility] 12/05/2018, 9:23 AM

## 2018-12-05 NOTE — Plan of Care (Signed)
Marissa Adams is progressing WNL and has a written d/c.   Baby is slightly jaundiced and has not stooled except terminal mec so baby d/c maybe late today

## 2018-12-05 NOTE — Plan of Care (Signed)
Patient teaching complete.  Waiting on baby to stool for d.c

## 2018-12-12 ENCOUNTER — Emergency Department (HOSPITAL_COMMUNITY)
Admission: EM | Admit: 2018-12-12 | Discharge: 2018-12-12 | Disposition: A | Discharge status: Home / Self Care | Payer: BC Managed Care – PPO | Attending: Emergency Medicine | Admitting: Emergency Medicine

## 2018-12-12 ENCOUNTER — Emergency Department (HOSPITAL_COMMUNITY): Payer: BC Managed Care – PPO

## 2018-12-12 ENCOUNTER — Other Ambulatory Visit: Payer: Self-pay

## 2018-12-12 ENCOUNTER — Encounter (HOSPITAL_COMMUNITY): Payer: Self-pay | Admitting: Emergency Medicine

## 2018-12-12 DIAGNOSIS — Z79899 Other long term (current) drug therapy: Secondary | ICD-10-CM | POA: Insufficient documentation

## 2018-12-12 DIAGNOSIS — R000 Tachycardia, unspecified: Secondary | ICD-10-CM | POA: Insufficient documentation

## 2018-12-12 DIAGNOSIS — E119 Type 2 diabetes mellitus without complications: Secondary | ICD-10-CM | POA: Diagnosis not present

## 2018-12-12 DIAGNOSIS — R0789 Other chest pain: Secondary | ICD-10-CM | POA: Insufficient documentation

## 2018-12-12 DIAGNOSIS — O99893 Other specified diseases and conditions complicating puerperium: Secondary | ICD-10-CM | POA: Diagnosis present

## 2018-12-12 DIAGNOSIS — R079 Chest pain, unspecified: Secondary | ICD-10-CM

## 2018-12-12 LAB — BASIC METABOLIC PANEL
Anion gap: 9 (ref 5–15)
BUN: 7 mg/dL (ref 6–20)
CO2: 20 mmol/L — ABNORMAL LOW (ref 22–32)
Calcium: 8.6 mg/dL — ABNORMAL LOW (ref 8.9–10.3)
Chloride: 109 mmol/L (ref 98–111)
Creatinine, Ser: 0.91 mg/dL (ref 0.44–1.00)
GFR calc Af Amer: >60 mL/min (ref >60)
GFR calc non Af Amer: >60 mL/min (ref >60)
Glucose, Bld: 77 mg/dL (ref 70–99)
Potassium: 3.8 mmol/L (ref 3.5–5.1)
Sodium: 138 mmol/L (ref 135–145)

## 2018-12-12 LAB — TROPONIN I (HIGH SENSITIVITY)
Troponin I (High Sensitivity): 3 ng/L (ref <18)
Troponin I (High Sensitivity): 2 ng/L (ref <18)

## 2018-12-12 LAB — CBC
HCT: 38.3 % (ref 36.0–46.0)
Hemoglobin: 13.1 g/dL (ref 12.0–15.0)
MCH: 31.3 pg (ref 26.0–34.0)
MCHC: 34.2 g/dL (ref 30.0–36.0)
MCV: 91.6 fL (ref 80.0–100.0)
Platelets: 377 10*3/uL (ref 150–400)
RBC: 4.18 MIL/uL (ref 3.87–5.11)
RDW: 14.1 % (ref 11.5–15.5)
WBC: 8.5 10*3/uL (ref 4.0–10.5)
nRBC: 0 % (ref 0.0–0.2)

## 2018-12-12 MED ORDER — SODIUM CHLORIDE 0.9% FLUSH
3.0000 mL | Freq: Once | INTRAVENOUS | Status: AC
  Administered 2018-12-12: 3 mL via INTRAVENOUS

## 2018-12-12 MED ORDER — TECHNETIUM TO 99M ALBUMIN AGGREGATED: 1.6000 | Freq: Once | INTRAVENOUS | Status: DC | PRN

## 2018-12-12 NOTE — ED Notes (Signed)
Pt reports Left lateral breast pain radiating to Left shoulder and central chest that worsens with a deep breathing and movement. Pt states her symptoms started 2 days ago in her Left breast area, she thought her breast was engorged with milk, the pain did not subside after pumping and has gradually worsened over the last 2 days

## 2018-12-12 NOTE — ED Provider Notes (Signed)
[facility] EMERGENCY DEPARTMENT Provider Note   CSN: [id number] Arrival date & time: 12/12/18  1007     History   Chief Complaint Chief Complaint  Patient presents with  . Chest Pain    HPI RUKIYA HODGKINS is a 33 y.o. female.     HPI Patient presents with chest pain.  Left chest going to left shoulder.  She is 1 week postpartum after vaginal delivery.  States the pain is worse with deep breaths.  Does not really feel short of breath however.  No swelling in her legs.  No fevers.  No cough.  No abdominal pain.  Has not been around anyone sick.  History of Crohn's disease.  No abdominal pain.  Still mild vaginal bleeding from the pregnancy. Past Medical History:  Diagnosis Date  . Blood transfusion without reported diagnosis    Crohn's Disease  . Crohn's disease (HCC)   . 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 Vaginal Pap smear, abnormal   . Vitamin D deficiency     Patient Active Problem List   Diagnosis Date Noted  . Normal postpartum course 12/04/2018  . Polyhydramnios affecting pregnancy in third trimester 12/03/2018  . Status post vacuum-assisted vaginal delivery 12/03/2018  . Postpartum care following VAVD 9/22 12/03/2018  . AKI (acute kidney injury) (HCC) 11/04/2018  . Pneumonia due to COVID-19 virus 11/04/2018  . Transaminitis 11/04/2018  . Pregnancy and infectious disease in third trimester 11/04/2018  . Crohn's disease of rectum with abscess (HCC) 04/26/2016  . Vaginal delivery 04/26/2016  . Sickle cell trait (HCC) 04/25/2016  . Abscess of buttock, left 04/25/2016  . Perianal abscess 11/18/2015  . Crohn disease (HCC) 12/08/2011    Past Surgical History:  Procedure Laterality Date  . BUNIONECTOMY     L foot     OB History    Gravida  4   Para  2   Term  2   Preterm      AB  1   Living  2     SAB  1   TAB      Ectopic      Multiple  0   Live Births  2            Home  Medications    Prior to Admission medications   Medication Sig Start Date End Date Taking? Authorizing Provider  famotidine (PEPCID) 40 MG tablet Take 1 tablet (40 mg total) by mouth every evening. Patient taking differently: Take 20 mg by mouth every evening.  10/24/18 10/24/19  Nugent, Odie Sera, NP  inFLIXimab (REMICADE) 100 MG injection Inject 100 mg into the vein every 6 (six) weeks.    [provider]  Polyethyl Glycol-Propyl Glycol (SYSTANE FREE OP) Place 1 drop into both eyes daily as needed (For dry eyes).     [provider]  Prenatal Vit-Fe Fumarate-FA (PRENATAL MULTIVITAMIN) TABS tablet Take 1 tablet by mouth daily.     [provider]  vitamin C (VITAMIN C) 500 MG tablet Take 1 tablet (500 mg total) by mouth daily. 11/09/18   Marguerita Merles Latif, DO  zinc sulfate 220 (50 Zn) MG capsule Take 1 capsule (220 mg total) by mouth daily. 11/09/18   Merlene Laughter, DO    Family History Family History  Problem Relation Age of Onset  . Diabetes Mother   . Stroke Paternal Uncle   . Hypertension Maternal Grandmother   . Diabetes  Maternal Grandmother   . Hypertension Paternal Grandmother     Social History Social History   Tobacco Use  . Smoking status: Never Smoker  . Smokeless tobacco: Never Used  Substance Use Topics  . Alcohol use: No    Alcohol/week: 0.0 standard drinks  . Drug use: Yes    Types: Hydrocodone    Comment: beginning of pregnancy     Allergies   Contrast media [iodinated diagnostic agents]   Review of Systems Review of Systems  Constitutional: Negative for appetite change, chills and fever.  HENT: Negative for congestion.   Cardiovascular: Positive for chest pain. Negative for leg swelling.  Gastrointestinal: Negative for abdominal pain.  Genitourinary: Positive for vaginal bleeding.  Skin: Negative for rash.  Neurological: Negative for weakness.  Psychiatric/Behavioral: Negative for confusion.     Physical Exam  Updated Vital Signs BP 115/76   Pulse (!) 104   Temp 99.1 F (37.3 C) (Oral)   Resp (!) 27   Ht 5\' 4"  (1.626 m)   Wt 72.6 kg   LMP 03/05/2018   SpO2 99%   Breastfeeding Yes Comment: Pt gave birth ~7 days ago  BMI 27.46 kg/m   Physical Exam Vitals signs and nursing note reviewed.  Constitutional:      Appearance: She is well-developed.  HENT:     Head: Atraumatic.  Cardiovascular:     Rate and Rhythm: Tachycardia present.     Heart sounds: No murmur.  Pulmonary:     Breath sounds: No wheezing, rhonchi or rales.  Chest:     Chest wall: No tenderness.  Abdominal:     Tenderness: There is no abdominal tenderness.  Musculoskeletal:     Right lower leg: No edema.     Left lower leg: No edema.  Skin:    General: Skin is warm.     Capillary Refill: Capillary refill takes less than 2 seconds.      ED Treatments / Results  Labs (all labs ordered are listed, but only abnormal results are displayed) Labs Reviewed  BASIC METABOLIC PANEL - Abnormal; Notable for the following components:      Result Value   CO2 20 (*)    Calcium 8.6 (*)    All other components within normal limits  CBC  TROPONIN I (HIGH SENSITIVITY)  TROPONIN I (HIGH SENSITIVITY)    EKG EKG Interpretation  Date/Time:  Thursday December 12 2018 10:45:01 EDT Ventricular Rate:  94 PR Interval:    QRS Duration: 113 QT Interval:  372 QTC Calculation: 466 R Axis:   -32 Text Interpretation:  Sinus rhythm Borderline short PR interval Probable left atrial enlargement Incomplete right bundle branch block Inferior infarct, age indeterminate e Confirmed by Davonna Belling [phone]) on 12/12/2018 10:54:21 AM   Radiology Dg Chest 2 View  Result Date: 12/12/2018 CLINICAL DATA:  Left chest pain, shortness of breath EXAM: CHEST - 2 VIEW COMPARISON:  11/06/2018 FINDINGS: Small left pleural effusion with left base atelectasis. Right lung is clear. Heart is normal size. No acute bony abnormality. IMPRESSION: Small  left pleural effusion with left base atelectasis. Electronically Signed   By: Rolm Baptise M.D.   On: 12/12/2018 11:02   Nm Pulmonary Perfusion  Result Date: 12/12/2018 CLINICAL DATA:  Left lateral breast pain radiating to left shoulder and central chest. EXAM: NUCLEAR MEDICINE PERFUSION LUNG SCAN TECHNIQUE: Perfusion images were obtained in multiple projections after intravenous injection of radiopharmaceutical. Ventilation scans intentionally deferred if perfusion scan and chest x-ray adequate for interpretation  during COVID 19 epidemic. RADIOPHARMACEUTICALS:  1.6 mCi Tc-67m MAA IV COMPARISON:  Chest x-ray from today. FINDINGS: On the chest radiograph from today there are bilateral pleural effusions identified. No airspace opacities noted. The perfusion images show no segmental perfusion defects identified. IMPRESSION: No segmental perfusion defects identified to suggest acute pulmonary embolus. Electronically Signed   By: Signa Kell M.D.   On: 12/12/2018 14:16    Procedures Procedures (including critical care time)  Medications Ordered in ED Medications  technetium albumin aggregated (MAA) injection solution 1.6 millicurie (has no administration in time range)  sodium chloride flush (NS) 0.9 % injection 3 mL (3 mLs Intravenous Given 12/12/18 1138)     Initial Impression / Assessment and Plan / ED Course  I have reviewed the triage vital signs and the nursing notes.  Pertinent labs & imaging results that were available during my care of the patient were reviewed by me and considered in my medical decision making (see chart for details).        Patient with left-sided chest pain.  Mild tachycardia initially.  Recently postpartum.  X-ray reassuring.  Lungs clear.  Could be pleurisy.  Negative pulmonary perfusion scan.  No pneumonia.  EKG nonspecific.  Outpatient follow-up with PCP.  Discussed with patient.postpartum cardiac complications also. Final Clinical Impressions(s) / ED  Diagnoses   Final diagnoses:  Nonspecific chest pain    ED Discharge Orders    None       Benjiman Core, MD 12/12/18 1549

## 2018-12-12 NOTE — ED Notes (Signed)
Pt transferred to NM 

## 2018-12-12 NOTE — ED Notes (Signed)
Pt dc'd home w/all belongings, pt a/o x4, driven home by family

## 2018-12-12 NOTE — Discharge Instructions (Addendum)
Your EKG is reassuring.  Your troponin does not show heart damage.  The VQ scan does not show pulmonary embolism.  However with the radiation you are given you will need to pump the breast milk for the next 24 hours.  It can be used around a week from now however.  Follow up with your doctor if more shortness of breath develops.

## 2018-12-12 NOTE — ED Triage Notes (Signed)
Pt reports substernal CP pain worse with inspiration. Reports delivered a baby 1 week ago. Denies fever, cough. Sick contacts.

## 2021-05-31 ENCOUNTER — Encounter: Payer: Self-pay | Admitting: Cardiology

## 2021-05-31 ENCOUNTER — Other Ambulatory Visit: Payer: Self-pay

## 2021-05-31 ENCOUNTER — Ambulatory Visit: Payer: BC Managed Care – PPO | Admitting: Cardiology

## 2021-05-31 VITALS — BP 143/88 | HR 93 | Temp 97.5°F | Resp 16 | Ht 64.0 in | Wt 160.0 lb

## 2021-05-31 DIAGNOSIS — R0602 Shortness of breath: Secondary | ICD-10-CM

## 2021-05-31 DIAGNOSIS — R002 Palpitations: Secondary | ICD-10-CM

## 2021-05-31 DIAGNOSIS — I456 Pre-excitation syndrome: Secondary | ICD-10-CM

## 2021-05-31 NOTE — Progress Notes (Signed)
? ?Date:  05/31/2021  ? ?ID:  Marissa Adams, DOB 1985-11-01, MRN [id number] ? ?PCP:  Marissa Bill, MD  ?Cardiologist:  Marissa Kras, DO, [facility] (established care 05/31/2021) ? ?REASON FOR CONSULT: Shortness of breath, palpitations, WPW pattern ? ?REQUESTING PHYSICIAN:  ?Marissa Bill, MD ?[address] ?[address] [place],  [place] [id number] ? ?Chief Complaint  ?Patient presents with  ? Palpitations  ? Shortness of Breath  ? New Patient (Initial Visit)  ? Abnormal ECG  ?  WPW  ? ? ?HPI  ?Marissa Adams is a 36 y.o. African-American female whose past medical history and cardiovascular risk factors include: Crohn's disease.  ? ?She is referred to the office at the request of Marissa Bill, MD for evaluation of shortness of breath, palpitations, WPW pattern. ? ?Approxi-2 months ago patient experienced a upper respiratory tract illness and towards the end of the episode patient started having coughing spells and shortness of breath.  She was treated for upper respiratory illness with antibiotics which helped her symptoms overall but she continued to have shortness of breath with effort related activities and coughing spells after eating.  When she revisited her PCP EKG was performed which noted WPW pattern now referred to cardiology for further evaluation and management. ? ?Patient denies any anginal discomfort, she has experienced fluttering like sensation in the past which has been short-lived, no associated near-syncope or syncopal events. ? ?She did not place any sports growing up but did not experience any syncopal events in gym class.  ? ?No family history of premature coronary disease or sudden cardiac death. ? ?FUNCTIONAL STATUS: ?No structured exercise program or daily routine.  ? ?ALLERGIES: ?Allergies  ?Allergen Reactions  ? Contrast Media [Iodinated Contrast Media] Hives  ? ? ?MEDICATION LIST PRIOR TO VISIT: ?Current Meds  ?Medication Sig  ? acetaminophen (TYLENOL) 500 MG tablet  Take by mouth.  ? inFLIXimab (REMICADE) 100 MG injection Inject 100 mg into the vein every 6 (six) weeks.  ? Polyethyl Glycol-Propyl Glycol (SYSTANE FREE OP) Place 1 drop into both eyes daily as needed (For dry eyes).   ?  ? ?PAST MEDICAL HISTORY: ?Past Medical History:  ?Diagnosis Date  ? Blood transfusion without reported diagnosis   ? Crohn's Disease  ? Crohn's disease ([place])   ? Diabetes mellitus without complication ([place])   ? drug induced diabetes  ? Vaginal Pap smear, abnormal   ? Vitamin D deficiency   ? ? ?PAST SURGICAL HISTORY: ?Past Surgical History:  ?Procedure Laterality Date  ? BUNIONECTOMY    ? L foot  ? ? ?FAMILY HISTORY: ?The patient family history includes Asthma in her brother; Diabetes in her maternal grandmother and mother; Hypertension in her maternal grandmother and paternal grandmother; Stroke in her paternal uncle. ? ?SOCIAL HISTORY:  ?The patient  reports that she has never smoked. She has never used smokeless tobacco. She reports that she does not currently use drugs after having used the following drugs: Hydrocodone. She reports that she does not drink alcohol. ? ?REVIEW OF SYSTEMS: ?Review of Systems  ?Cardiovascular:  Positive for dyspnea on exertion. Negative for chest pain, leg swelling, orthopnea, palpitations, paroxysmal nocturnal dyspnea and syncope.  ?Respiratory:  Positive for cough (coughing spells after eating.) and shortness of breath.   ? ?PHYSICAL EXAM: ?Vitals with BMI 05/31/2021 12/12/2018 12/12/2018  ?Height 5\' 4"  - -  ?Weight 160 lbs - -  ?BMI 27.45 - -  ?Systolic [id number]  ?Diastolic 88 76  80  ?Pulse 93 104 96  ? ? ?CONSTITUTIONAL: Well-developed and well-nourished. No acute distress.  ?SKIN: Skin is warm and dry. No rash noted. No cyanosis. No pallor. No jaundice ?HEAD: Normocephalic and atraumatic.  ?EYES: No scleral icterus ?MOUTH/THROAT: Moist oral membranes.  ?NECK: No JVD present. No thyromegaly noted. No carotid bruits  ?LYMPHATIC: No visible cervical adenopathy.   ?CHEST Normal respiratory effort. No intercostal retractions  ?LUNGS: Clear to auscultation bilaterally.  No stridor. No wheezes. No rales.  ?CARDIOVASCULAR: Regular rate and rhythm, positive S1-S2, no murmurs rubs or gallops appreciated. ?ABDOMINAL: Soft, nontender, nondistended, positive bowel sounds in all 4 quadrants, no apparent ascites.  ?EXTREMITIES: No peripheral edema, warm to touch, 2+ bilateral DP and PT pulses ?HEMATOLOGIC: No significant bruising ?NEUROLOGIC: Oriented to person, place, and time. Nonfocal. Normal muscle tone.  ?PSYCHIATRIC: Normal mood and affect. Normal behavior. Cooperative ? ?CARDIAC DATABASE: ?EKG: ?05/31/2021: Sinus  Rhythm, 77bpm, IVCD, WPW pattern.  ? ?Echocardiogram: ?No results found for this or any previous visit from the past 1095 days. ?  ? ?Stress Testing: ?No results found for this or any previous visit from the past 1095 days. ? ? ?Heart Catheterization: ?None ? ?LABORATORY DATA: ?CBC Latest Ref Rng & Units 12/12/2018 12/04/2018 12/03/2018  ?WBC 4.0 - 10.5 K/uL 8.5 10.1 8.7  ?Hemoglobin 12.0 - 15.0 g/dL 13.1 11.0(L) 10.9(L)  ?Hematocrit 36.0 - 46.0 % 38.3 32.4(L) 31.8(L)  ?Platelets 150 - 400 K/uL 377 247 279  ? ? ?CMP Latest Ref Rng & Units 12/12/2018 12/03/2018 11/08/2018  ?Glucose 70 - 99 mg/dL 77 82 70  ?BUN 6 - 20 mg/dL 7 6 12   ?Creatinine 0.44 - 1.00 mg/dL 0.91 0.78 1.77(H)  ?Sodium 135 - 145 mmol/L 138 136 139  ?Potassium 3.5 - 5.1 mmol/L 3.8 3.8 3.7  ?Chloride 98 - 111 mmol/L 109 107 115(H)  ?CO2 22 - 32 mmol/L 20(L) 19(L) 16(L)  ?Calcium 8.9 - 10.3 mg/dL 8.6(L) 8.8(L) 8.0(L)  ?Total Protein 6.5 - 8.1 g/dL - 6.5 5.4(L)  ?Total Bilirubin 0.3 - 1.2 mg/dL - 0.9 2.9(H)  ?Alkaline Phos 38 - 126 U/L - 114 104  ?AST 15 - 41 U/L - 15 89(H)  ?ALT 0 - 44 U/L - 9 107(H)  ? ? ?Lipid Panel  ?   ?Component Value Date/Time  ? CHOL 123 03/11/2014 1155  ? TRIG 251 (H) 11/08/2018 0558  ? HDL 50.20 03/11/2014 1155  ? CHOLHDL 2 03/11/2014 1155  ? VLDL 9.0 03/11/2014 1155  ? [place] 64  03/11/2014 1155  ? ? ?No components found for: NTPROBNP ?No results for input(s): PROBNP in the last 8760 hours. ?No results for input(s): TSH in the last 8760 hours. ? ?BMP ?No results for input(s): NA, K, CL, CO2, GLUCOSE, BUN, CREATININE, CALCIUM, GFRNONAA, GFRAA in the last 8760 hours. ? ?HEMOGLOBIN A1C ?Lab Results  ?Component Value Date  ? HGBA1C 4.7 03/11/2014  ? ?External Labs: ?Collected: 05/16/2021 provided by PCP available in Care Everywhere. ?Hemoglobin 14.7 g/dL, hematocrit 45.4% ?D-dimer 500 NG/mL ? ?Collected 04/21/2021 available in Care Everywhere. ?Sodium 138, potassium 4.5, chloride 105, bicarb 24, BUN 9, creatinine 0.98. ?AST 17, ALT 16, alkaline phosphatase 63 ? ?IMPRESSION: ? ?  ICD-10-CM   ?1. Palpitations  R00.2 EKG 12-Lead  ?  PCV ECHOCARDIOGRAM COMPLETE  ?  PCV CARDIAC STRESS TEST  ?  Ambulatory referral to Cardiac Electrophysiology  ?  ?2. Shortness of breath  R06.02 PCV ECHOCARDIOGRAM COMPLETE  ?  PCV CARDIAC STRESS TEST  ?  Ambulatory referral to Cardiac Electrophysiology  ?  ?3. Wolff-Parkinson-White (WPW) pattern  I45.6 PCV ECHOCARDIOGRAM COMPLETE  ?  PCV CARDIAC STRESS TEST  ?  Ambulatory referral to Cardiac Electrophysiology  ?  ?  ? ?RECOMMENDATIONS: ?LEYANI VALVO is a 36 y.o. African-American female whose past medical history and cardiac risk factors include: Crohn's disease. ? ?Given her symptoms of palpitations, shortness of breath, and EKG noting short PR, upsloping of the QRS complex, wide QRS morphology patient needs to be evaluated for possible WPW syndrome. ? ?Echo will be ordered to evaluate for structural heart disease and left ventricular systolic function. ? ?Plan exercise treadmill stress test to evaluate for functional capacity, exercise-induced arrhythmias, and to evaluate her QRS complex with increased physical exertion. ? ?We will refer her to cardiac electrophysiology for further evaluation and management in the interim. ? ? ?FINAL MEDICATION LIST END OF  ENCOUNTER: ?No orders of the defined types were placed in this encounter. ?  ?Medications Discontinued During This Encounter  ?Medication Reason  ? famotidine (PEPCID) 40 MG tablet   ? Prenatal Vit-Fe Fumarate-FA [facility]

## 2021-06-03 ENCOUNTER — Other Ambulatory Visit: Payer: Self-pay

## 2021-06-03 ENCOUNTER — Ambulatory Visit: Payer: BC Managed Care – PPO

## 2021-06-03 DIAGNOSIS — R0602 Shortness of breath: Secondary | ICD-10-CM

## 2021-06-03 DIAGNOSIS — R002 Palpitations: Secondary | ICD-10-CM

## 2021-06-03 DIAGNOSIS — I456 Pre-excitation syndrome: Secondary | ICD-10-CM

## 2021-06-06 ENCOUNTER — Ambulatory Visit (INDEPENDENT_AMBULATORY_CARE_PROVIDER_SITE_OTHER): Payer: BC Managed Care – PPO | Admitting: Internal Medicine

## 2021-06-06 ENCOUNTER — Other Ambulatory Visit: Payer: Self-pay

## 2021-06-06 ENCOUNTER — Encounter: Payer: Self-pay | Admitting: Internal Medicine

## 2021-06-06 DIAGNOSIS — R002 Palpitations: Secondary | ICD-10-CM | POA: Insufficient documentation

## 2021-06-06 DIAGNOSIS — I456 Pre-excitation syndrome: Secondary | ICD-10-CM | POA: Diagnosis not present

## 2021-06-06 NOTE — Progress Notes (Signed)
        HPI Marissa Adams is referred by Dr. 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 She is a pleasant 35 yo woman with palpitations who was found to have ventricular pre-excitation on her ECG. Review of her old ECG's demonstrates that her ECG pattern was present back to 2020. She has occasional self limited palpitations. She has never had syncope. The patient has a normal 2D echo and a stress test is pending. She also notes a h/o Crohn's disease which appears to be controlled on Remicade. Review of her ECG demonstrates a right posteroseptal pathway.      Allergies  Allergen Reactions   Contrast Media [Iodinated Contrast Media] Hives              Current Outpatient Medications  Medication Sig Dispense Refill   acetaminophen (TYLENOL) 500 MG tablet Take by mouth.       inFLIXimab (REMICADE) 100 MG injection Inject 100 mg into the vein every 6 (six) weeks.       Polyethyl Glycol-Propyl Glycol (SYSTANE FREE OP) Place 1 drop into both eyes daily as needed (For dry eyes).         No current facility-administered medications for this visit.            Past Medical History:  Diagnosis Date   Blood transfusion without reported diagnosis      Crohn's Disease   Crohn's disease (HCC)     Diabetes mellitus without complication (HCC)      drug induced diabetes   Vaginal Pap smear, abnormal     Vitamin D deficiency        ROS:    All systems reviewed and negative except as noted in the HPI.          Past Surgical History:  Procedure Laterality Date   BUNIONECTOMY        L foot             Family History  Problem Relation Age of Onset   Diabetes Mother     Asthma Brother     Stroke Paternal Uncle     Hypertension Maternal Grandmother     Diabetes Maternal Grandmother     Hypertension Paternal Grandmother          Social History         Socioeconomic History   Marital status: Married      Spouse name: Not on file   Number of children: Not on file   Years of education:  Not on file   Highest education level: Not on file  Occupational History   Not on file  Tobacco Use   Smoking status: Never   Smokeless tobacco: Never  Vaping Use   Vaping Use: Never used  Substance and Sexual Activity   Alcohol use: No      Alcohol/week: 0.0 standard drinks   Drug use: Not Currently      Types: Hydrocodone      Comment: beginning of pregnancy   Sexual activity: Yes      Birth control/protection: None  Other Topics Concern   Not on file  Social History Narrative   Not on file    Social Determinants of Health    Financial Resource Strain: Not on file  Food Insecurity: Not on file  Transportation Needs: Not on file  Physical Activity: Not on file  Stress: Not on file  Social Connections: Not on file  Intimate Partner Violence: Not on file          BP 132/72   Pulse 78   Ht 5' 4" (1.626 m)   Wt 161 lb 6.4 oz (73.2 kg)   LMP 05/28/2021   SpO2 98%   Breastfeeding No   BMI 27.70 kg/m    Physical Exam:   Well appearing 35 yo woman, NAD HEENT: Unremarkable Neck:  No JVD, no thyromegally Lymphatics:  No adenopathy Back:  No CVA tenderness Lungs:  Clear with no wheezes HEART:  Regular rate rhythm, no murmurs, no rubs, no clicks Abd:  soft, positive bowel sounds, no organomegally, no rebound, no guarding Ext:  2 plus pulses, no edema, no cyanosis, no clubbing Skin:  No rashes no nodules Neuro:  CN II through XII intact, motor grossly intact   EKG - nsr with WPW   Assess/Plan:  WPW - I have discussed the treatment options with the patient and we specifically discussed the issues of sudden death. Her symptoms are not too severe but she is clearly pre-excited at baseline. She is pending a stress test. If her pathway conduction resolved with minimally increased HR, then I would recommend watchful waiting. If her pre-excitation does not resolved then I would recommend ablation for symptoms WPW. I have reviewe the  indications/risks/benefits/goals/expectations and she will call us if she wishes to proceed.   Janyth Riera,MD 

## 2021-06-06 NOTE — Patient Instructions (Addendum)
Medication Instructions:  ?Your physician recommends that you continue on your current medications as directed. Please refer to the Current Medication list given to you today. ? ?Labwork: ?None ordered. ? ?Testing/Procedures: ?Your physician has recommended that you have an ablation. Catheter ablation is a medical procedure used to treat some cardiac arrhythmias (irregular heartbeats). During catheter ablation, a long, thin, flexible tube is put into a blood vessel in your groin (upper thigh), or neck. This tube is called an ablation catheter. It is then guided to your heart through the blood vessel. Radio frequency waves destroy small areas of heart tissue where abnormal heartbeats may cause an arrhythmia to start. Please see the instruction sheet given to you today. ? ?Follow-Up: ? ?The following dates are available for this procedure: ?April 11, 18, 19 ?May 9, 11, 18, 23, 25, 30 ? ?Any Other Special Instructions Will Be Listed Below (If Applicable). ? ?If you need a refill on your cardiac medications before your next appointment, please call your pharmacy.  ? ?Cardiac Ablation ?Cardiac ablation is a procedure to destroy, or ablate, a small amount of heart tissue in very specific places. The heart has many electrical connections. Sometimes these connections are abnormal and can cause the heart to beat very fast or irregularly. Ablating some of the areas that cause problems can improve the heart's rhythm or return it to normal. Ablation may be done for people who: ?Have Wolff-Parkinson-White syndrome. ?Have fast heart rhythms (tachycardia). ?Have taken medicines for an abnormal heart rhythm (arrhythmia) that were not effective or caused side effects. ?Have a high-risk heartbeat that may be life-threatening. ?During the procedure, a small incision is made in the neck or the groin, and a long, thin tube (catheter) is inserted into the incision and moved to the heart. Small devices (electrodes) on the tip of the  catheter will send out electrical currents. A type of X-ray (fluoroscopy) will be used to help guide the catheter and to provide images of the heart. ?Tell a health care provider about: ?Any allergies you have. ?All medicines you are taking, including vitamins, herbs, eye drops, creams, and over-the-counter medicines. ?Any problems you or family members have had with anesthetic medicines. ?Any blood disorders you have. ?Any surgeries you have had. ?Any medical conditions you have, such as kidney failure. ?Whether you are pregnant or may be pregnant. ?What are the risks? ?Generally, this is a safe procedure. However, problems may occur, including: ?Infection. ?Bruising and bleeding at the catheter insertion site. ?Bleeding into the chest, especially into the sac that surrounds the heart. This is a serious complication. ?Stroke or blood clots. ?Damage to nearby structures or organs. ?Allergic reaction to medicines or dyes. ?Need for a permanent pacemaker if the normal electrical system is damaged. A pacemaker is a small computer that sends electrical signals to the heart and helps your heart beat normally. ?The procedure not being fully effective. This may not be recognized until months later. Repeat ablation procedures are sometimes done. ?What happens before the procedure? ?Medicines ?Ask your health care provider about: ?Changing or stopping your regular medicines. This is especially important if you are taking diabetes medicines or blood thinners. ?Taking medicines such as aspirin and ibuprofen. These medicines can thin your blood. Do not take these medicines unless your health care provider tells you to take them. ?Taking over-the-counter medicines, vitamins, herbs, and supplements. ?General instructions ?Follow instructions from your health care provider about eating or drinking restrictions. ?Plan to have someone take you home from the hospital  or clinic. ?If you will be going home right after the procedure,  plan to have someone with you for 24 hours. ?Ask your health care provider what steps will be taken to prevent infection. ?What happens during the procedure? ? ?An IV will be inserted into one of your veins. ?You will be given a medicine to help you relax (sedative). ?The skin on your neck or groin will be numbed. ?An incision will be made in your neck or your groin. ?A needle will be inserted through the incision and into a large vein in your neck or groin. ?A catheter will be inserted into the needle and moved to your heart. ?Dye may be injected through the catheter to help your surgeon see the area of the heart that needs treatment. ?Electrical currents will be sent from the catheter to ablate heart tissue in desired areas. There are three types of energy that may be used to do this: ?Heat (radiofrequency energy). ?Laser energy. ?Extreme cold (cryoablation). ?When the tissue has been ablated, the catheter will be removed. ?Pressure will be held on the insertion area to prevent a lot of bleeding. ?A bandage (dressing) will be placed over the insertion area. ?The exact procedure may vary among health care providers and hospitals. ?What happens after the procedure? ?Your blood pressure, heart rate, breathing rate, and blood oxygen level will be monitored until you leave the hospital or clinic. ?Your insertion area will be monitored for bleeding. You will need to lie still for a few hours to ensure that you do not bleed from the insertion area. ?Do not drive for 24 hours or as long as told by your health care provider. ?Summary ?Cardiac ablation is a procedure to destroy, or ablate, a small amount of heart tissue using an electrical current. This procedure can improve the heart rhythm or return it to normal. ?Tell your health care provider about any medical conditions you may have and all medicines you are taking to treat them. ?This is a safe procedure, but problems may occur. Problems may include infection,  bruising, damage to nearby organs or structures, or allergic reactions to medicines. ?Follow your health care provider's instructions about eating and drinking before the procedure. You may also be told to change or stop some of your medicines. ?After the procedure, do not drive for 24 hours or as long as told by your health care provider. ?This information is not intended to replace advice given to you by your health care provider. Make sure you discuss any questions you have with your health care provider. ?Document Revised: 01/06/2019 Document Reviewed: 01/06/2019 ?Elsevier Patient Education ? 2022 [employer]. ? ? ? ? ?

## 2021-06-07 ENCOUNTER — Encounter: Payer: Self-pay | Admitting: Internal Medicine

## 2021-06-08 ENCOUNTER — Telehealth: Payer: Self-pay

## 2021-06-08 DIAGNOSIS — I456 Pre-excitation syndrome: Secondary | ICD-10-CM

## 2021-06-08 NOTE — Telephone Encounter (Signed)
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 ?Will get lab work September 01, 2021 ? ?Work up complete ?

## 2021-06-09 IMAGING — US US RENAL
1 series · 14 of 25 positions shown · non-contrast
Comparison: CT abdomen 06/30/2008

CLINICAL DATA: Third trimester pregnancy with diabetes and acute
renal failure.

EXAM:
RENAL / URINARY TRACT ULTRASOUND COMPLETE

[Series 1: us renal · 26 acquisitions, 14 frames shown]
[im 1/26]
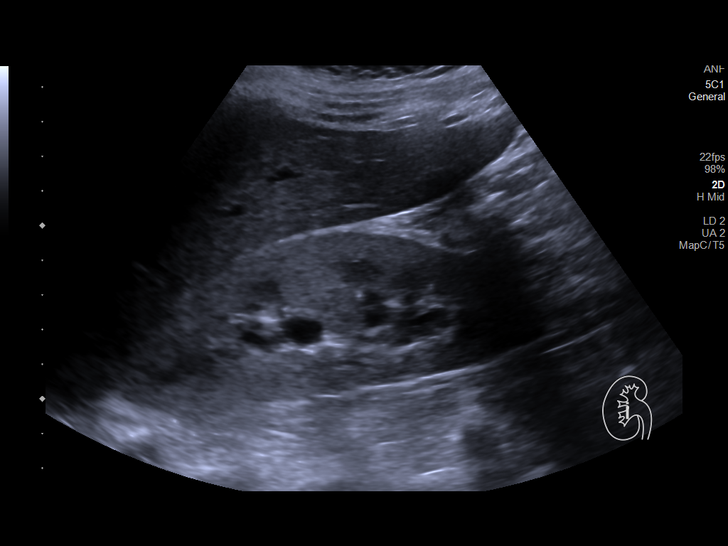
[im 3/26]
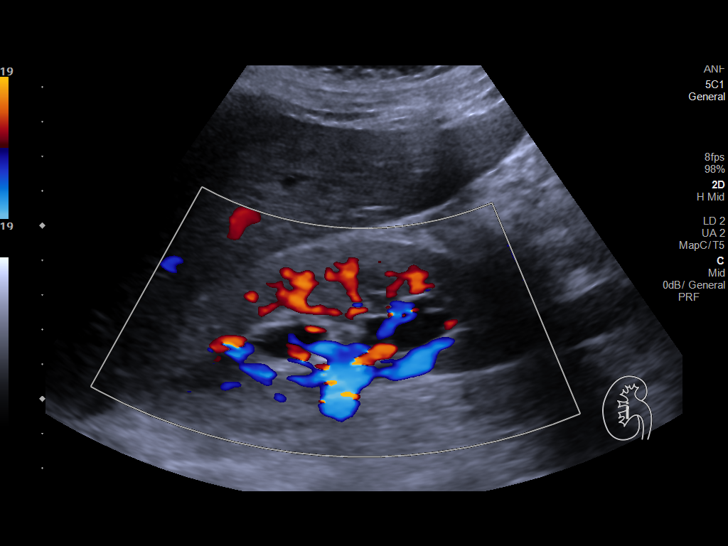
[im 5/26]
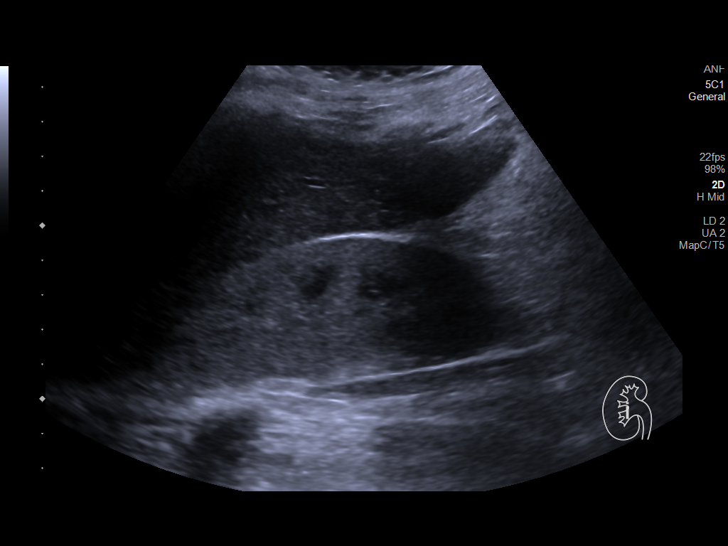
[im 7/26]
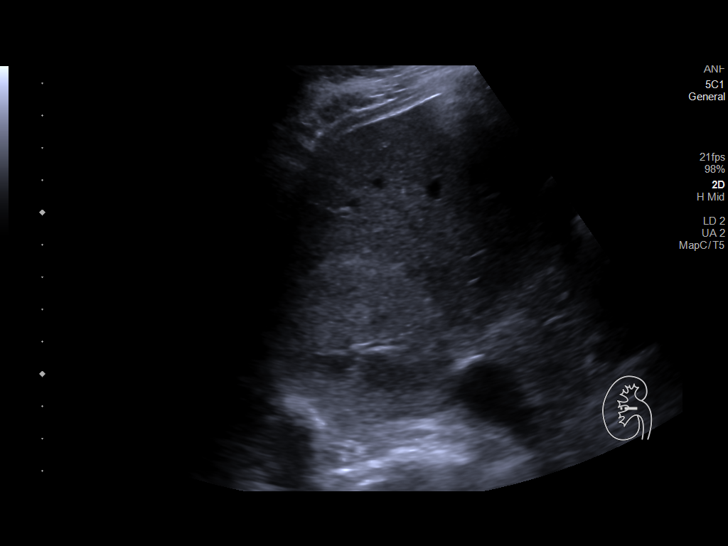
[im 9/26]
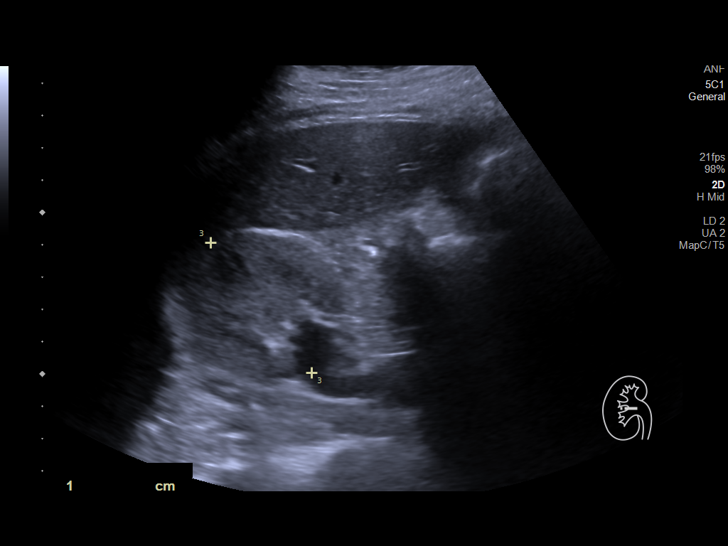
[im 10/26]
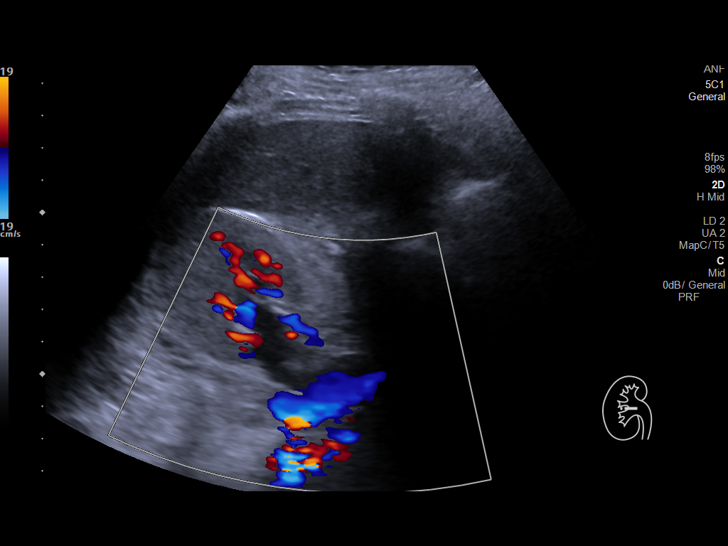
[im 12/26]
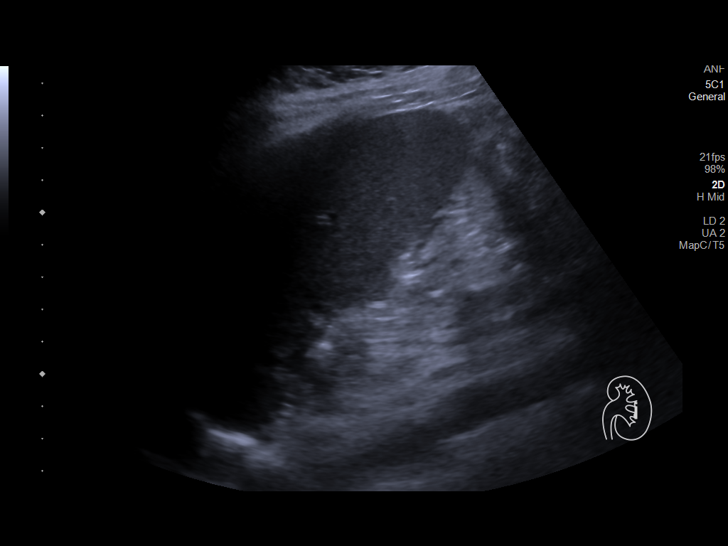
[im 14/26]
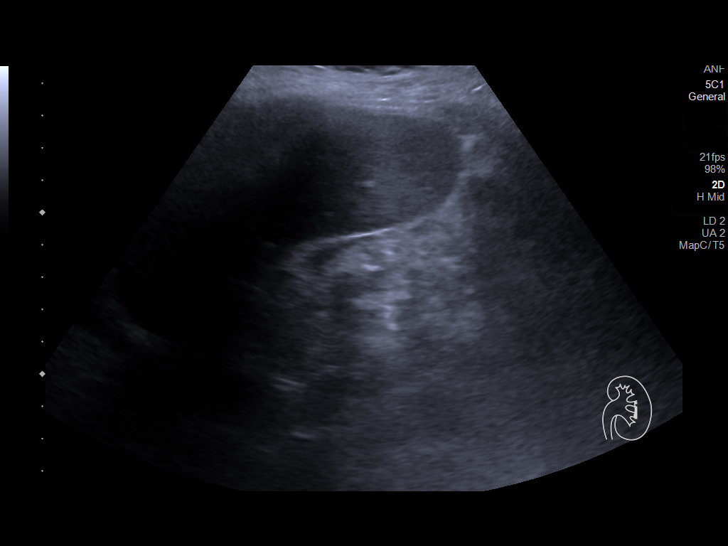
[im 16/26]
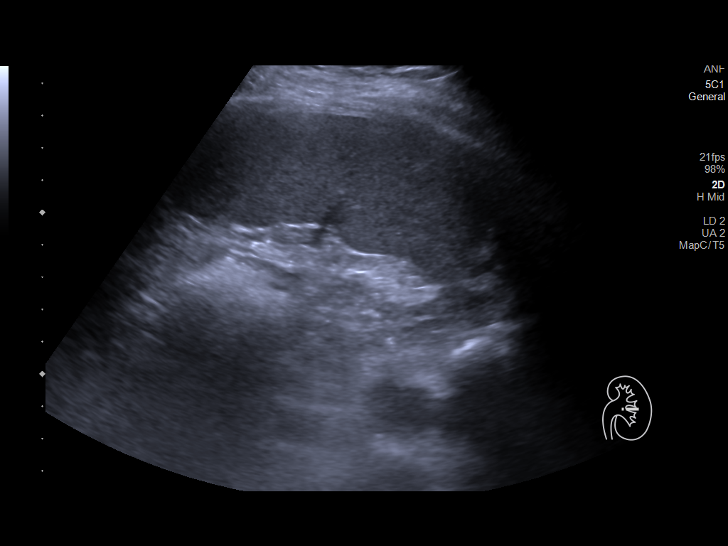
[im 17/26]
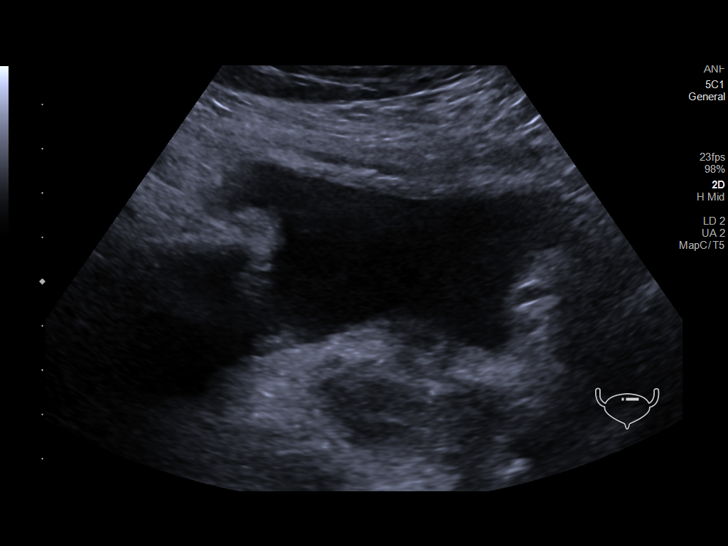
[im 19/26]
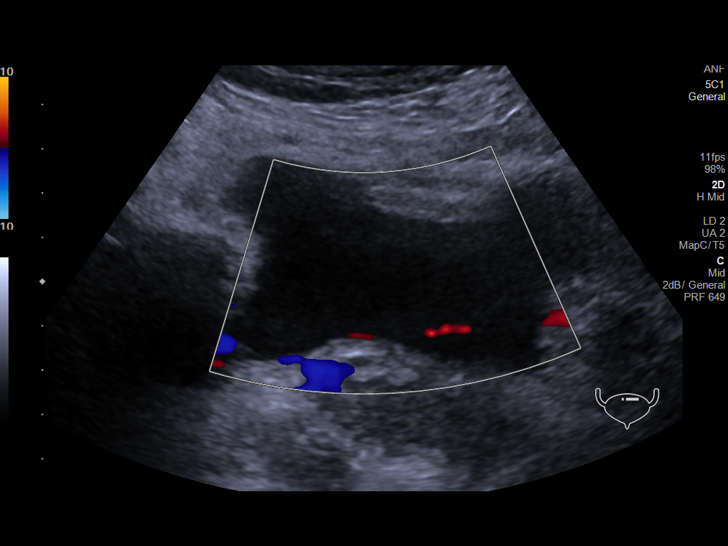
[im 21/26]
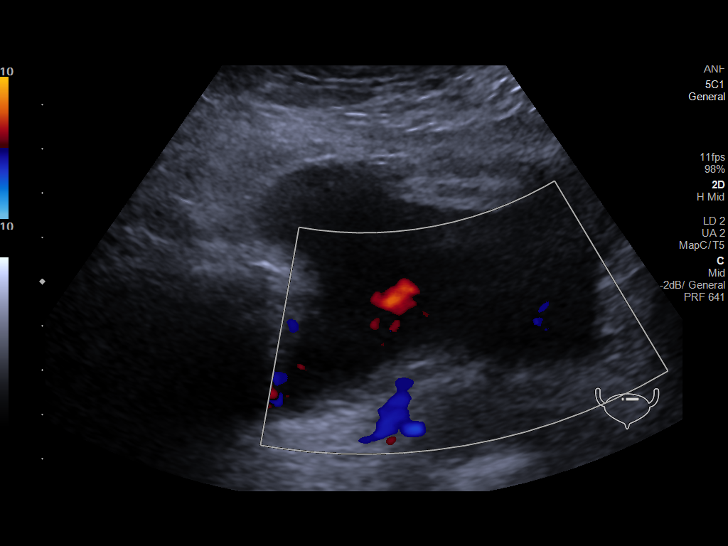
[im 23/26]
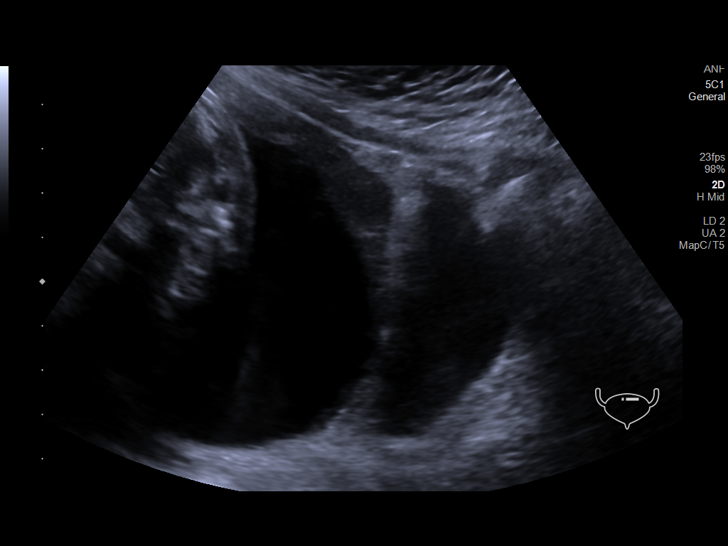
[im 26/26]
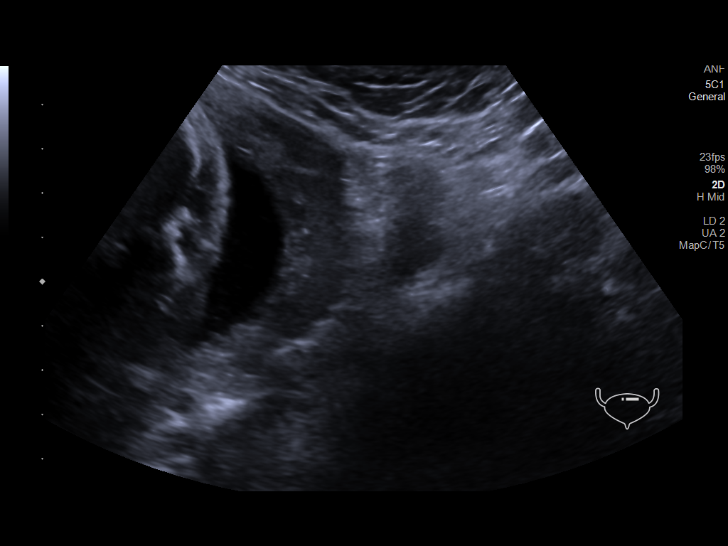

[14 of 25 positions shown; findings below may reference images not displayed]

FINDINGS: Right Kidney:

Renal measurements: 13 x 4.5 x 5.1 cm = volume: 158 mL. Increased
echogenicity of the renal parenchyma. Mild fullness of the renal
collecting system, often seen in third trimester pregnancy.

Left Kidney:

The left kidney was not identified by the technologist. Based on
previous CT, this was low lying in the inferior left retroperitoneum
and may have been inapparent based on the pregnancy.

Bladder:

Bilateral ureteral jets are visible.
IMPRESSION: Right kidney is echogenic suggesting renal parenchymal disease.
Fullness of the renal collecting system as is usually seen in third
trimester pregnancy. Ureteral jet argues against true obstruction.

The left kidney is not visualized by the technologist. Based on the
previous CT, the left kidney is ectopic within the lower
retroperitoneum. Left ureteral jet suggests that there is a
functioning left kidney present, but not seen by sonography.

## 2021-06-10 IMAGING — DX PORTABLE CHEST - 1 VIEW
1 series · 1 of 1 positions shown · non-contrast
Comparison: Portable exam 4166 hours compared to 11/04/2018

CLINICAL DATA: Onset of shortness of breath 2 day, history diabetes
mellitus, Crohn's disease

EXAM:
PORTABLE CHEST 1 VIEW

[chest ap]
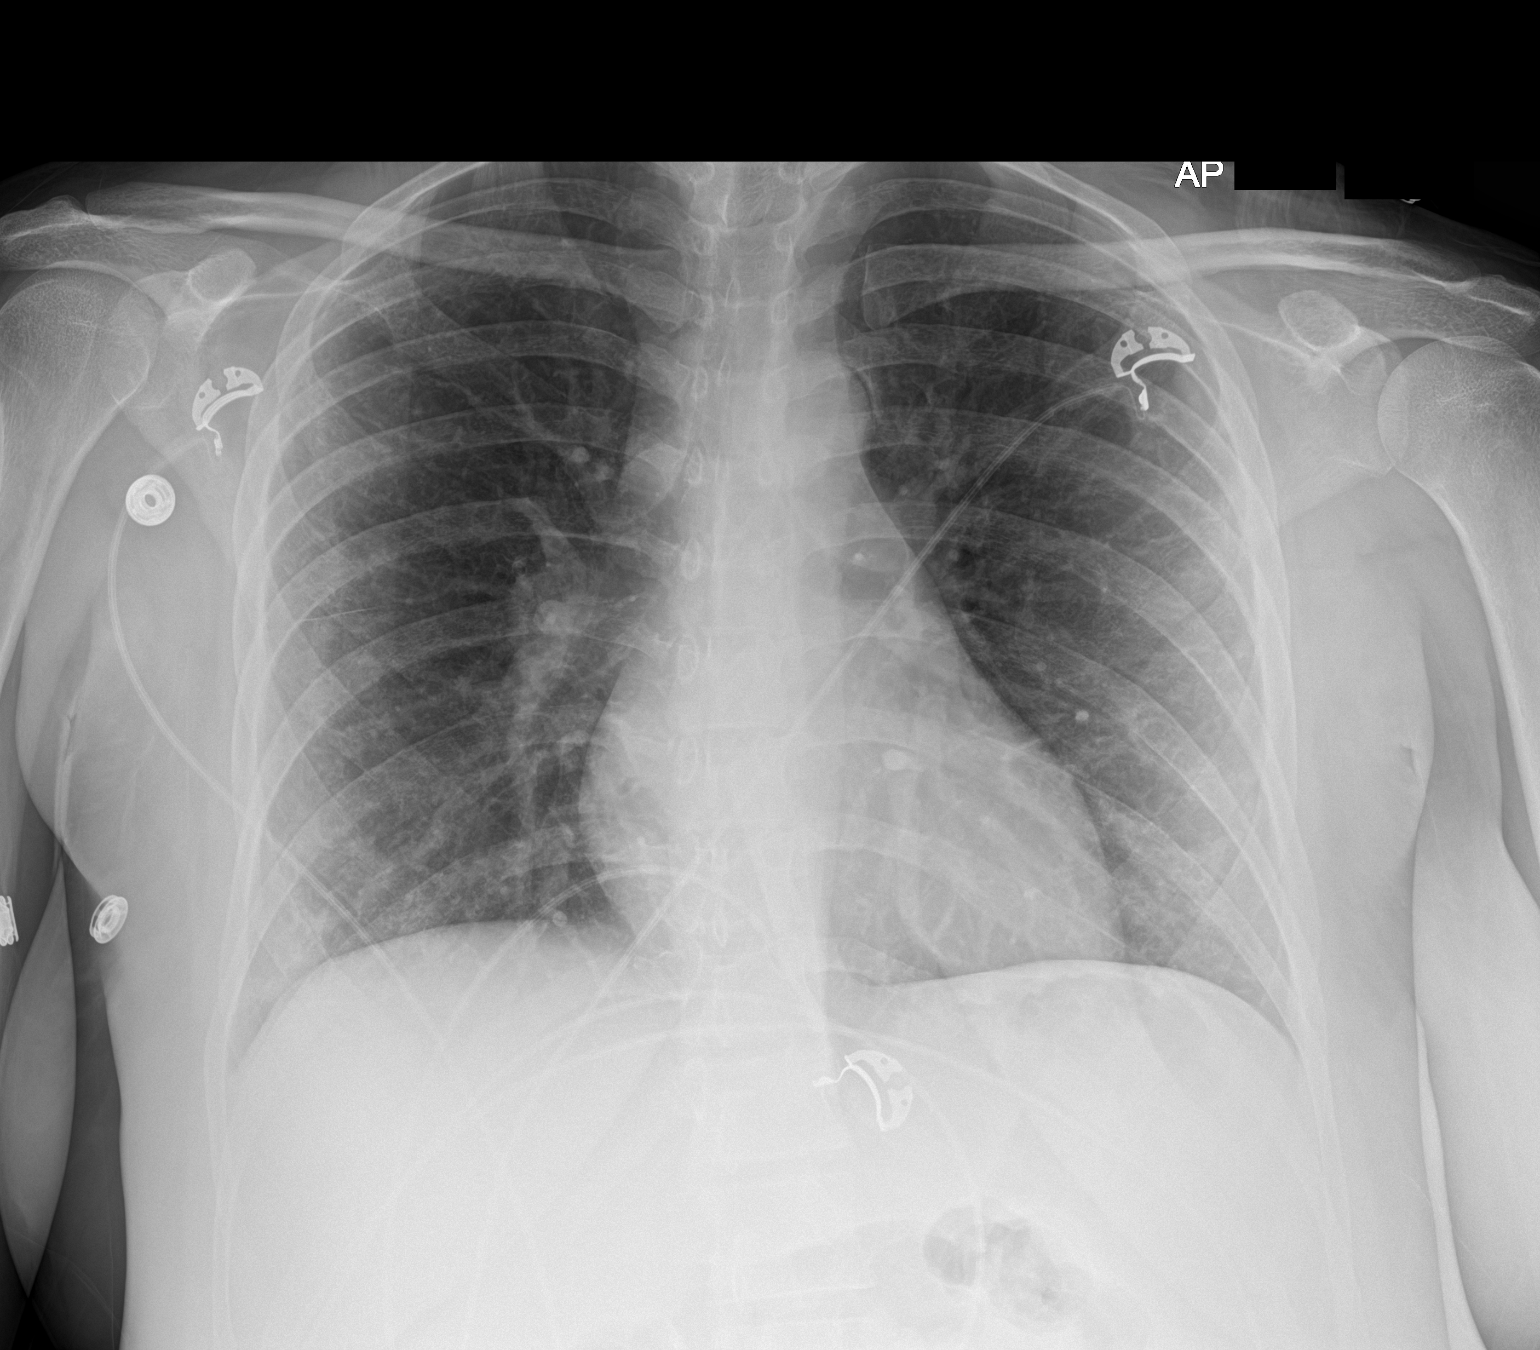

[1 of 1 positions shown; findings below may reference images not displayed]

FINDINGS: Normal heart size, mediastinal contours, and pulmonary vascularity.

Hazy opacities in the periphery of both lungs slightly greater on
RIGHT, question atypical infiltrate.

Remaining lungs clear.

No pleural effusion or pneumothorax.

Osseous structures unremarkable.
IMPRESSION: Hazy opacities in the periphery of both lungs slightly greater on
RIGHT question atypical pneumonia, minimally more prominent on LEFT
than on prior study.

## 2021-06-24 ENCOUNTER — Ambulatory Visit: Payer: BC Managed Care – PPO

## 2021-06-24 DIAGNOSIS — R0602 Shortness of breath: Secondary | ICD-10-CM

## 2021-06-24 DIAGNOSIS — I456 Pre-excitation syndrome: Secondary | ICD-10-CM

## 2021-06-24 DIAGNOSIS — R002 Palpitations: Secondary | ICD-10-CM

## 2021-07-16 IMAGING — NM NM PULMONARY PERF PARTICULATE
8 series · 8 of 8 positions shown · non-contrast
Comparison: Chest x-ray from today.

CLINICAL DATA: Left lateral breast pain radiating to left shoulder
and central chest.

EXAM:
NUCLEAR MEDICINE PERFUSION LUNG SCAN
TECHNIQUE: Perfusion images were obtained in multiple projections after
intravenous injection of radiopharmaceutical.
Ventilation scans intentionally deferred if perfusion scan and chest
x-ray adequate for interpretation during COVID 19 epidemic.
RADIOPHARMACEUTICALS:  1.6 mCi Hc-ZZm MAA IV

[Series 1: ant/post perf · 4.14mm/px · 1 of 1 slices shown (1 of 2)]
[im 1/1]
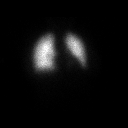

[Series 1: ant/post perf · 4.14mm/px · 1 of 1 slices shown (2 of 2)]
[im 1/1]
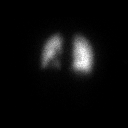

[Series 2: lao/rpo perf · 4.14mm/px · 1 of 1 slices shown (1 of 2)]
[im 1/1]
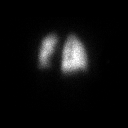

[Series 2: lao/rpo perf · 4.14mm/px · 1 of 1 slices shown (2 of 2)]
[im 1/1]
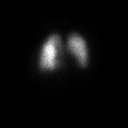

[Series 3: lpo/rao perf · 4.14mm/px · 1 of 1 slices shown (1 of 2)]
[im 1/1]
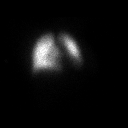

[Series 3: lpo/rao perf · 4.14mm/px · 1 of 1 slices shown (2 of 2)]
[im 1/1]
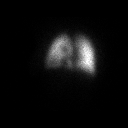

[Series 4: lt lat/rt lat perf · 4.14mm/px · 1 of 1 slices shown (1 of 2)]
[im 1/1]
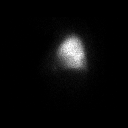

[Series 4: lt lat/rt lat perf · 4.14mm/px · 1 of 1 slices shown (2 of 2)]
[im 1/1]
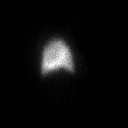

[8 of 8 positions shown; findings below may reference images not displayed]

FINDINGS: On the chest radiograph from today there are bilateral pleural
effusions identified. No airspace opacities noted.

The perfusion images show no segmental perfusion defects identified.
IMPRESSION: No segmental perfusion defects identified to suggest acute pulmonary
embolus.

## 2021-09-01 ENCOUNTER — Ambulatory Visit: Payer: BC Managed Care – PPO | Admitting: Cardiology

## 2021-09-01 ENCOUNTER — Other Ambulatory Visit: Payer: BC Managed Care – PPO

## 2021-09-01 DIAGNOSIS — I456 Pre-excitation syndrome: Secondary | ICD-10-CM

## 2021-09-01 LAB — BASIC METABOLIC PANEL
BUN/Creatinine Ratio: 9 (ref 9–23)
BUN: 10 mg/dL (ref 6–20)
CO2: 27 mmol/L (ref 20–29)
Calcium: 9.2 mg/dL (ref 8.7–10.2)
Chloride: 104 mmol/L (ref 96–106)
Creatinine, Ser: 1.07 mg/dL — ABNORMAL HIGH (ref 0.57–1.00)
Glucose: 94 mg/dL (ref 70–99)
Potassium: 4.3 mmol/L (ref 3.5–5.2)
Sodium: 137 mmol/L (ref 134–144)
eGFR: 69 mL/min/{1.73_m2} (ref >59)

## 2021-09-01 LAB — CBC WITH DIFFERENTIAL/PLATELET
Basophils Absolute: 0 10*3/uL (ref 0.0–0.2)
Basos: 0 %
EOS (ABSOLUTE): 0.1 10*3/uL (ref 0.0–0.4)
Eos: 1 %
Hematocrit: 38.6 % (ref 34.0–46.6)
Hemoglobin: 13.4 g/dL (ref 11.1–15.9)
Lymphocytes Absolute: 4.6 10*3/uL — ABNORMAL HIGH (ref 0.7–3.1)
Lymphs: 56 %
MCH: 31.1 pg (ref 26.6–33.0)
MCHC: 34.7 g/dL (ref 31.5–35.7)
MCV: 90 fL (ref 79–97)
Monocytes Absolute: 0.4 10*3/uL (ref 0.1–0.9)
Monocytes: 5 %
Neutrophils Absolute: 3.1 10*3/uL (ref 1.4–7.0)
Neutrophils: 38 %
Platelets: 303 10*3/uL (ref 150–450)
RBC: 4.31 x10E6/uL (ref 3.77–5.28)
RDW: 12.9 % (ref 11.7–15.4)
WBC: 8.2 10*3/uL (ref 3.4–10.8)

## 2021-09-05 ENCOUNTER — Ambulatory Visit: Payer: BC Managed Care – PPO | Admitting: Cardiology

## 2021-09-07 NOTE — Pre-Procedure Instructions (Signed)
Attempted to call patient regarding procedure instructions.  Left voice mail on the following items: Arrival time 0530 Nothing to eat or drink after midnight No meds AM of procedure Responsible person to drive you home and stay with you for 24 hrs      

## 2021-09-08 ENCOUNTER — Other Ambulatory Visit: Payer: Self-pay

## 2021-09-08 ENCOUNTER — Ambulatory Visit (HOSPITAL_COMMUNITY)
Admission: RE | Admit: 2021-09-08 | Discharge: 2021-09-08 | Disposition: A | Discharge status: Home / Self Care | Payer: BC Managed Care – PPO | Attending: Internal Medicine | Admitting: Internal Medicine

## 2021-09-08 ENCOUNTER — Encounter (HOSPITAL_COMMUNITY)
Admission: RE | Disposition: A | Discharge status: Home / Self Care | Payer: Self-pay | Source: Home / Self Care | Attending: Internal Medicine

## 2021-09-08 DIAGNOSIS — I471 Supraventricular tachycardia: Secondary | ICD-10-CM | POA: Diagnosis not present

## 2021-09-08 DIAGNOSIS — I456 Pre-excitation syndrome: Secondary | ICD-10-CM | POA: Diagnosis not present

## 2021-09-08 HISTORY — PX: SVT ABLATION: EP1225

## 2021-09-08 LAB — GLUCOSE, CAPILLARY
Glucose-Capillary: 94 mg/dL (ref 70–99)
Glucose-Capillary: 81 mg/dL (ref 70–99)
Glucose-Capillary: 77 mg/dL (ref 70–99)

## 2021-09-08 LAB — PREGNANCY, URINE: Preg Test, Ur: NEGATIVE

## 2021-09-08 SURGERY — SVT ABLATION

## 2021-09-08 MED ORDER — MIDAZOLAM HCL 5 MG/5ML IJ SOLN
INTRAMUSCULAR | Status: AC
  Filled 2021-09-08: qty 5

## 2021-09-08 MED ORDER — FENTANYL CITRATE (PF) 100 MCG/2ML IJ SOLN
INTRAMUSCULAR | Status: DC | PRN
Start: 2021-09-08 — End: 2021-09-08
  Administered 2021-09-08: 25 ug via INTRAVENOUS
  Administered 2021-09-08 (×3): 12.5 ug via INTRAVENOUS
  Administered 2021-09-08 (×2): 25 ug via INTRAVENOUS

## 2021-09-08 MED ORDER — BUPIVACAINE HCL (PF) 0.25 % IJ SOLN
INTRAMUSCULAR | Status: DC | PRN
  Administered 2021-09-08: 30 mL

## 2021-09-08 MED ORDER — HEPARIN (PORCINE) IN NACL 1000-0.9 UT/500ML-% IV SOLN
INTRAVENOUS | Status: DC | PRN
  Administered 2021-09-08: 500 mL

## 2021-09-08 MED ORDER — FENTANYL CITRATE (PF) 100 MCG/2ML IJ SOLN
INTRAMUSCULAR | Status: AC
  Filled 2021-09-08: qty 2

## 2021-09-08 MED ORDER — MIDAZOLAM HCL 5 MG/5ML IJ SOLN
INTRAMUSCULAR | Status: DC | PRN
  Administered 2021-09-08: 2 mg via INTRAVENOUS
  Administered 2021-09-08 (×2): 1 mg via INTRAVENOUS
  Administered 2021-09-08: 2 mg via INTRAVENOUS
  Administered 2021-09-08: 1 mg via INTRAVENOUS
  Administered 2021-09-08: 2 mg via INTRAVENOUS

## 2021-09-08 MED ORDER — SODIUM CHLORIDE 0.9% FLUSH: 3.0000 mL | INTRAVENOUS | Status: DC | PRN

## 2021-09-08 MED ORDER — SODIUM CHLORIDE 0.9 % IV SOLN: INTRAVENOUS | Status: DC

## 2021-09-08 MED ORDER — ACETAMINOPHEN 325 MG PO TABS: 650.0000 mg | ORAL_TABLET | ORAL | Status: DC | PRN

## 2021-09-08 MED ORDER — ONDANSETRON HCL 4 MG/2ML IJ SOLN: 4.0000 mg | Freq: Four times a day (QID) | INTRAMUSCULAR | Status: DC | PRN

## 2021-09-08 MED ORDER — SODIUM CHLORIDE 0.9 % IV SOLN: 250.0000 mL | INTRAVENOUS | Status: DC | PRN

## 2021-09-08 MED ORDER — BUPIVACAINE HCL (PF) 0.25 % IJ SOLN
INTRAMUSCULAR | Status: AC
  Filled 2021-09-08: qty 30

## 2021-09-08 SURGICAL SUPPLY — 10 items
CATH EZ STEER NAV 4MM D-F CUR (ABLATOR) ×1 IMPLANT
CATH JOSEPH QUAD ALLRED 6F REP (CATHETERS) ×2 IMPLANT
CATH POLARIS X REPROCESSED (CATHETERS) ×1 IMPLANT
PACK EP LATEX FREE (CUSTOM PROCEDURE TRAY) ×2
PACK EP LF (CUSTOM PROCEDURE TRAY) ×2 IMPLANT
PAD DEFIB RADIO PHYSIO CONN (PAD) ×3 IMPLANT
PATCH CARTO3 (PAD) ×1 IMPLANT
SHEATH PINNACLE 6F 10CM (SHEATH) ×2 IMPLANT
SHEATH PINNACLE 7F 10CM (SHEATH) ×1 IMPLANT
SHEATH PINNACLE 8F 10CM (SHEATH) ×1 IMPLANT

## 2021-09-08 NOTE — Progress Notes (Signed)
Site area: rt groin fv sheaths x3 Site Prior to Removal:  Level 0 Pressure Applied For: 15 minutes Manual:   yes Patient Status During Pull:  stable Post Pull Site:  Level 0 Post Pull Instructions Given:  yes Post Pull Pulses Present: rt dp palpable Dressing Applied:  gauze and tegaderm Bedrest begins @ 1010 Comments:

## 2021-09-08 NOTE — Progress Notes (Signed)
Patient and husband was given discharge instructions. Both verbalized understanding. 

## 2021-09-08 NOTE — H&P (Signed)
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 Review of her old ECG's demonstrates that her ECG pattern was present back to 2020. She has occasional self limited palpitations. She has never had syncope. The patient has a normal 2D echo and a stress test is pending. She also notes a h/o Crohn's disease which appears to be controlled on Remicade. Review of her ECG demonstrates a right posteroseptal pathway.      Allergies  Allergen Reactions   Contrast Media [Iodinated Contrast Media] Hives              Current Outpatient Medications  Medication Sig Dispense Refill   acetaminophen (TYLENOL) 500 MG tablet Take by mouth.       inFLIXimab (REMICADE) 100 MG injection Inject 100 mg into the vein every 6 (six) weeks.       Polyethyl Glycol-Propyl Glycol (SYSTANE FREE OP) Place 1 drop into both eyes daily as needed (For dry eyes).         No current facility-administered medications for this visit.            Past Medical History:  Diagnosis Date   Blood transfusion without reported diagnosis      Crohn's Disease   Crohn's disease (HCC)     Diabetes mellitus without complication (HCC)      drug induced diabetes   Vaginal Pap smear, abnormal     Vitamin D deficiency        ROS:    All systems reviewed and negative except as noted in the HPI.          Past Surgical History:  Procedure Laterality Date   BUNIONECTOMY        L foot             Family History  Problem Relation Age of Onset   Diabetes Mother     Asthma Brother     Stroke Paternal Uncle     Hypertension Maternal Grandmother     Diabetes Maternal Grandmother     Hypertension Paternal Grandmother          Social History         Socioeconomic History   Marital status: Married      Spouse name: Not on file   Number of children: Not on file   Years of education:  Not on file   Highest education level: Not on file  Occupational History   Not on file  Tobacco Use   Smoking status: Never   Smokeless tobacco: Never  Vaping Use   Vaping Use: Never used  Substance and Sexual Activity   Alcohol use: No      Alcohol/week: 0.0 standard drinks   Drug use: Not Currently      Types: Hydrocodone      Comment: beginning of pregnancy   Sexual activity: Yes      Birth control/protection: None  Other Topics Concern   Not on file  Social History Narrative   Not on file    Social Determinants of Health    Financial Resource Strain: Not on file  Food Insecurity: Not on file  Transportation Needs: Not on file  Physical Activity: Not on file  Stress: Not on file  Social Connections: Not on file  Intimate Partner Violence: Not on file  BP 132/72   Pulse 78   Ht 5\' 4"  (1.626 m)   Wt 161 lb 6.4 oz (73.2 kg)   LMP 05/28/2021   SpO2 98%   Breastfeeding No   BMI 27.70 kg/m    Physical Exam:   Well appearing 35 yo woman, NAD HEENT: Unremarkable Neck:  No JVD, no thyromegally Lymphatics:  No adenopathy Back:  No CVA tenderness Lungs:  Clear with no wheezes HEART:  Regular rate rhythm, no murmurs, no rubs, no clicks Abd:  soft, positive bowel sounds, no organomegally, no rebound, no guarding Ext:  2 plus pulses, no edema, no cyanosis, no clubbing Skin:  No rashes no nodules Neuro:  CN II through XII intact, motor grossly intact   EKG - nsr with WPW   Assess/Plan:  WPW - I have discussed the treatment options with the patient and we specifically discussed the issues of sudden death. Her symptoms are not too severe but she is clearly pre-excited at baseline. She is pending a stress test. If her pathway conduction resolved with minimally increased HR, then I would recommend watchful waiting. If her pre-excitation does not resolved then I would recommend ablation for symptoms WPW. I have reviewe the  indications/risks/benefits/goals/expectations and she will call 31 if she wishes to proceed.   [place] Damiyah Ditmars,MD

## 2021-09-08 NOTE — Progress Notes (Signed)
Dr Ladona Ridgel in to see client and ok to d/c home

## 2021-09-08 NOTE — Discharge Instructions (Signed)

## 2021-09-09 ENCOUNTER — Encounter (HOSPITAL_COMMUNITY): Payer: Self-pay | Admitting: Internal Medicine

## 2021-09-20 ENCOUNTER — Encounter: Payer: Self-pay | Admitting: Cardiology

## 2021-09-20 ENCOUNTER — Ambulatory Visit: Payer: BC Managed Care – PPO | Admitting: Cardiology

## 2021-09-20 VITALS — BP 113/76 | HR 77 | Temp 98.0°F | Resp 17 | Ht 64.0 in | Wt 161.2 lb

## 2021-09-20 DIAGNOSIS — I456 Pre-excitation syndrome: Secondary | ICD-10-CM

## 2021-09-20 DIAGNOSIS — R002 Palpitations: Secondary | ICD-10-CM

## 2021-09-20 NOTE — Progress Notes (Signed)
Date:  09/20/2021   ID:  Marissa Adams, DOB [birth year], MRN [id number]  PCP:  Verlon Au, MD  Cardiologist:  Tessa Lerner, DO, [facility] (established care 05/31/2021)  Date: 09/20/21 Last Office Visit: 05/31/2021  Chief Complaint  Patient presents with   Follow-up    WPW, status post ablation    HPI  Marissa Adams is a 36 y.o. 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   Patient was referred to the practice for evaluation of palpitations and shortness of breath.  Surface ECG noted WPW pattern.  Given her young age and symptoms she was referred to cardiac electrophysiology for further evaluation and management.  She underwent SVT ablation of the right posterior septal accessory pathway on September 08, 2021 with Dr. Ladona Ridgel.  No postoperative complications.   Patient presents for follow-up.  Patient has noted more frequent episodes of palpitations but the symptoms are shorter in duration when compared to the past.  She denies any near-syncope or syncopal events.  She has an upcoming appointment with Dr. Ladona Ridgel later this month.   FUNCTIONAL STATUS: No structured exercise program or daily routine.   ALLERGIES: Allergies  Allergen Reactions   Contrast Media [Iodinated Contrast Media] Hives    MEDICATION LIST PRIOR TO VISIT: Current Meds  Medication Sig   acetaminophen (TYLENOL) 500 MG tablet Take 1,000 mg by mouth every 8 (eight) hours as needed for moderate pain.   inFLIXimab (REMICADE) 100 MG injection Inject 100 mg into the vein every 6 (six) weeks.   Polyethyl Glycol-Propyl Glycol (SYSTANE FREE OP) Place 1 drop into both eyes as needed (For dry eyes).     PAST MEDICAL HISTORY: Past Medical History:  Diagnosis Date   Blood transfusion without reported diagnosis    Crohn's Disease   Crohn's disease (HCC)    Diabetes mellitus without  complication (HCC)    drug induced diabetes   Vaginal Pap smear, abnormal    Vitamin D deficiency     PAST SURGICAL HISTORY: Past Surgical History:  Procedure Laterality Date   BUNIONECTOMY     L foot   SVT ABLATION N/A 09/08/2021   Procedure: SVT ABLATION;  Surgeon: Marinus Maw, MD;  Location: [facility] INVASIVE CV LAB;  Service: Cardiovascular;  Laterality: N/A;    FAMILY HISTORY: The patient family history includes Asthma in her brother; Diabetes in her maternal grandmother and mother; Hypertension in her maternal grandmother and paternal grandmother; Stroke in her paternal uncle.  SOCIAL HISTORY:  The patient  reports that she has never smoked. She has never used smokeless tobacco. She reports that she does not currently use drugs after having used the following drugs: Hydrocodone. She reports that she does not drink alcohol.  REVIEW OF SYSTEMS: Review of Systems  Cardiovascular:  Positive for palpitations. Negative for chest pain, dyspnea on exertion, leg swelling, orthopnea, paroxysmal nocturnal dyspnea and syncope.  Respiratory:  Negative for cough and shortness of breath.     PHYSICAL EXAM:    09/20/2021    9:45 AM 09/08/2021    4:00 PM 09/08/2021    3:00 PM  Vitals with BMI  Height 5\' 4"     Weight 161 lbs 3 oz    BMI 27.66    Systolic 113 103  Diastolic 76 69 62  Pulse 77 87 86    CONSTITUTIONAL: Well-developed and well-nourished. No acute distress.  SKIN: Skin is warm and dry.  No rash noted. No cyanosis. No pallor. No jaundice HEAD: Normocephalic and atraumatic.  EYES: No scleral icterus MOUTH/THROAT: Moist oral membranes.  NECK: No JVD present. No thyromegaly noted. No carotid bruits  CHEST Normal respiratory effort. No intercostal retractions  LUNGS: Clear to auscultation bilaterally.  No stridor. No wheezes. No rales.  CARDIOVASCULAR: Regular rate and rhythm, positive S1-S2, no murmurs rubs or gallops appreciated. ABDOMINAL: Soft, nontender, nondistended,  positive bowel sounds in all 4 quadrants, no apparent ascites.  EXTREMITIES: No peripheral edema, warm to touch, 2+ bilateral DP and PT pulses HEMATOLOGIC: No significant bruising NEUROLOGIC: Oriented to person, place, and time. Nonfocal. Normal muscle tone.  PSYCHIATRIC: Normal mood and affect. Normal behavior. Cooperative  CARDIAC DATABASE: CARDIAC ELECTROPHYSIOLOGY: 09/08/2021: Attending physician: Dr. Ladona Ridgel Conclusion: Successful EP study and catheter ablation of a manifest right posteroseptal accessory pathway with inducible atrial fibrillation and maximal ventricular preexcitation.  Following catheter ablation, there was no inducible arrhythmia.  In addition following catheter ablation there is no evidence of any residual accessory pathway conduction.   EKG: 05/31/2021: Sinus  Rhythm, 77bpm, IVCD, WPW pattern.  09/20/2021: Sinus bradycardia, 59 bpm, without underlying injury pattern.  Compared to prior ECG WPW pattern is not present  Echocardiogram: 06/03/2021:  Normal LV systolic function with EF 61%. Left ventricle cavity is normal in size. Normal left ventricular wall thickness. Normal global wall motion. Normal diastolic filling pattern. Calculated EF 61%.  Structurally normal mitral valve.  Mild (Grade I) mitral regurgitation.  Stress Testing: Exercise treadmill stress test 06/24/2021: Functional status: Fair.  Chest pain: No.  Reason for stopping exercise: Maximum exercise capacity achieved.  Hypertensive response to exercise: No.  Exercise time 6 minutes 36 seconds on Bruce protocol, achieved 7.94 METS, 87% of age-predicted maximum heart rate (APMHR).  Rest EKG illustrates WPW which normalizes 4 minutes and 30 seconds into exercise and resurfaces during recovery.   Stress ECG negative for ischemia.   Low risk study.  Heart Catheterization: None  LABORATORY DATA:    Latest Ref Rng & Units 09/01/2021    2:21 PM 12/12/2018   10:44 AM 12/04/2018    6:01 AM  CBC  WBC 3.4  - 10.8 x10E3/uL 8.2  8.5  10.1   Hemoglobin 11.1 - 15.9 g/dL 42.3  53.6  14.4   Hematocrit 34.0 - 46.6 % 38.6  38.3  32.4   Platelets 150 - 450 x10E3/uL 303  377  247        Latest Ref Rng & Units 09/01/2021    2:21 PM 12/12/2018   10:44 AM 12/03/2018    3:04 AM  CMP  Glucose 70 - 99 mg/dL 94  77  82   BUN 6 - 20 mg/dL 10  7  6    Creatinine 0.57 - 1.00 mg/dL  3.15  4.00   Sodium 134 - 144 mmol/L 137  138  136   Potassium 3.5 - 5.2 mmol/L 4.3  3.8  3.8   Chloride 96 - 106 mmol/L 104  109  107   CO2 20 - 29 mmol/L 27  20  19    Calcium 8.7 - 10.2 mg/dL 9.2  8.6  8.8   Total Protein 6.5 - 8.1 g/dL   6.5   Total Bilirubin 0.3 - 1.2 mg/dL   0.9   Alkaline Phos 38 - 126 U/L   114   AST 15 - 41 U/L   15   ALT 0 - 44 U/L   9     Lipid  Panel     Component Value Date/Time   CHOL 123 03/11/2014 1155   TRIG 251 (H) 11/08/2018 0558   HDL 50.20 03/11/2014 1155   CHOLHDL 2 03/11/2014 1155   VLDL 9.0 03/11/2014 1155   LDLCALC 64 03/11/2014 1155    No components found for: "NTPROBNP" No results for input(s): "PROBNP" in the last 8760 hours. No results for input(s): "TSH" in the last 8760 hours.  BMP Recent Labs    09/01/21 1421  NA 137  K 4.3  CL 104  CO2 27  GLUCOSE 94  BUN 10  CREATININE 1.07*  CALCIUM 9.2    HEMOGLOBIN A1C Lab Results  Component Value Date   HGBA1C 4.7 03/11/2014   External Labs: Collected: 05/16/2021 provided by PCP available in Care Everywhere. Hemoglobin 14.7 g/dL, hematocrit 45.4% D-dimer 500 NG/mL  Collected 04/21/2021 available in [facility]. Sodium 138, potassium 4.5, chloride 105, bicarb 24, BUN 9, creatinine 0.98. AST 17, ALT 16, alkaline phosphatase 63  IMPRESSION:    ICD-10-CM   1. Palpitations  R00.2 EKG 12-Lead    2. WPW (Wolff-Parkinson-White syndrome)  I45.6         RECOMMENDATIONS: Marissa Adams is a 36 y.o. African-American female whose past medical history and cardiac risk factors include: WPW (s/p right  posteroseptal accessory pathway 09/08/2021), Crohn's disease.   Patient was noted to have symptoms of palpitation and surface ECG noted WPW pattern.  She underwent echocardiogram and exercise treadmill stress test prior to being evaluated by cardiac electrophysiology for further risk stratification.  Her echocardiogram notes preserved LVEF and exercise treadmill stress test was negative for ischemia and WPW pattern temporarily normalizes during exercise and was present back in recovery.  Since then she is undergone SVT ablation of the right posterior septal accessory pathway without any postoperative complications.  She continues to have palpitations but overall duration is shorter compared to prior events.  She has an upcoming appointment with Dr. Lovena Le later this month.  May consider additional pharmacological therapy for symptom management.  She is more than welcome to see me back on an annual basis or Dr. Lovena Le as per their discussion.  FINAL MEDICATION LIST END OF ENCOUNTER: No orders of the defined types were placed in this encounter.   There are no discontinued medications.    Current Outpatient Medications:    acetaminophen (TYLENOL) 500 MG tablet, Take 1,000 mg by mouth every 8 (eight) hours as needed for moderate pain., Disp: , Rfl:    inFLIXimab (REMICADE) 100 MG injection, Inject 100 mg into the vein every 6 (six) weeks., Disp: , Rfl:    Polyethyl Glycol-Propyl Glycol (SYSTANE FREE OP), Place 1 drop into both eyes as needed (For dry eyes)., Disp: , Rfl:   Orders Placed This Encounter  Procedures   EKG 12-Lead    There are no Patient Instructions on file for this visit.   --Continue cardiac medications as reconciled in final medication list. --Return in about 1 year (around 09/21/2022), or if symptoms worsen or fail to improve. Or sooner if needed. --Continue follow-up with your primary care physician regarding the management of your other chronic comorbid  conditions.  Patient's questions and concerns were addressed to her satisfaction. She voices understanding of the instructions provided during this encounter.   This note was created using a voice recognition software as a result there may be grammatical errors inadvertently enclosed that do not reflect the nature of this encounter. Every attempt is made to correct such errors.  Rex Kras, [place], [facility]  Pager: [phone] Office: [phone]

## 2021-10-11 ENCOUNTER — Ambulatory Visit (INDEPENDENT_AMBULATORY_CARE_PROVIDER_SITE_OTHER): Payer: BC Managed Care – PPO | Admitting: Internal Medicine

## 2021-10-11 ENCOUNTER — Encounter: Payer: Self-pay | Admitting: Internal Medicine

## 2021-10-11 VITALS — BP 103/71 | HR 68 | Ht 64.0 in | Wt 165.0 lb

## 2021-10-11 DIAGNOSIS — I456 Pre-excitation syndrome: Secondary | ICD-10-CM | POA: Diagnosis not present

## 2021-10-11 NOTE — Patient Instructions (Addendum)
Medication Instructions:  Your physician recommends that you continue on your current medications as directed. Please refer to the Current Medication list given to you today.  *If you need a refill on your cardiac medications before your next appointment, please call your pharmacy*  Lab Work: None ordered.  If you have labs (blood work) drawn today and your tests are completely normal, you will receive your results only by: MyChart Message (if you have MyChart) OR A paper copy in the mail If you have any lab test that is abnormal or we need to change your treatment, we will call you to review the results.  Testing/Procedures: None ordered.  Follow-Up: At [facility], you and your health needs are our priority.  As part of our continuing mission to provide you with exceptional heart care, we have created designated Provider Care Teams.  These Care Teams include your primary Cardiologist (physician) and Advanced Practice Providers (APPs -  Physician Assistants and Nurse Practitioners) who all work together to provide you with the care you need, when you need it.  We recommend signing up for the patient portal called "MyChart".  Sign up information is provided on this After Visit Summary.  MyChart is used to connect with patients for Virtual Visits (Telemedicine).  Patients are able to view lab/test results, encounter notes, upcoming appointments, etc.  Non-urgent messages can be sent to your provider as well.   To learn more about what you can do with MyChart, go to [email or web address].    Your next appointment:   AS NEEDED  The format for your next appointment:   In Person  Provider:   Gregg Taylor, MD{or one of the following Advanced Practice Providers on your designated Care Team:   Renee Ursuy, PA-C Michael "Andy" Tillery, PA-C  Important Information About Sugar        

## 2021-10-11 NOTE — Progress Notes (Signed)
HPI Marissa Adams returns today for followup. The patient has a h/o SVT and WPW syndrome due to a posteroseptal AP who underwent EP study and catheter ablation. She has done well since her ablation. She had no evidence of pre-excitation after the procedure. She has had rare palpitations and no recurrent SVt.  Allergies  Allergen Reactions   Contrast Media [Iodinated Contrast Media] Hives     Current Outpatient Medications  Medication Sig Dispense Refill   acetaminophen (TYLENOL) 500 MG tablet Take 1,000 mg by mouth every 8 (eight) hours as needed for moderate pain.     inFLIXimab (REMICADE) 100 MG injection Inject 100 mg into the vein every 6 (six) weeks.     Polyethyl Glycol-Propyl Glycol (SYSTANE FREE OP) Place 1 drop into both eyes as needed (For dry eyes).     No current facility-administered medications for this visit.     Past Medical History:  Diagnosis Date   Blood transfusion without reported diagnosis    Crohn's Disease   Crohn's disease (HCC)    Diabetes mellitus without complication (HCC)    drug induced diabetes   Vaginal Pap smear, abnormal    Vitamin D deficiency     ROS:   All systems reviewed and negative except as noted in the HPI.   Past Surgical History:  Procedure Laterality Date   BUNIONECTOMY     L foot   SVT ABLATION N/A 09/08/2021   Procedure: SVT ABLATION;  Surgeon: Marinus Maw, MD;  Location: [facility] INVASIVE CV LAB;  Service: Cardiovascular;  Laterality: N/A;     Family History  Problem Relation Age of Onset   Diabetes Mother    Asthma Brother    Stroke Paternal Uncle    Hypertension Maternal Grandmother    Diabetes Maternal Grandmother    Hypertension Paternal Grandmother      Social History   Socioeconomic History   Marital status: Married    Spouse name: Not on file   Number of children: 3   Years of education: Not on file   Highest education level: Not on file  Occupational History   Not on file  Tobacco Use    Smoking status: Never   Smokeless tobacco: Never  Vaping Use   Vaping Use: Never used  Substance and Sexual Activity   Alcohol use: No    Alcohol/week: 0.0 standard drinks of alcohol   Drug use: Not Currently    Types: Hydrocodone    Comment: beginning of pregnancy   Sexual activity: Yes    Birth control/protection: None  Other Topics Concern   Not on file  Social History Narrative   Not on file   Social Determinants of Health   Financial Resource Strain: Low Risk  (11/26/2018)   Overall Financial Resource Strain (CARDIA)    Difficulty of Paying Living Expenses: Not hard at all  Food Insecurity: No Food Insecurity (11/26/2018)   Hunger Vital Sign    Worried About Running Out of Food in the Last Year: Never true    Ran Out of Food in the Last Year: Never true  Transportation Needs: No Transportation Needs (11/26/2018)   PRAPARE - Administrator, Civil Service (Medical): No    Lack of Transportation (Non-Medical): No  Physical Activity: Not on file  Stress: Not on file  Social Connections: Not on file  Intimate Partner Violence: Not on file     BP 103/71   Pulse 68   Ht 5'  4" (1.626 m)   Wt 165 lb (74.8 kg)   BMI 28.32 kg/m   Physical Exam:  Well appearing NAD HEENT: Unremarkable Neck:  No JVD, no thyromegally Lymphatics:  No adenopathy Back:  No CVA tenderness Lungs:  Clear with no wheezes HEART:  Regular rate rhythm, no murmurs, no rubs, no clicks Abd:  soft, positive bowel sounds, no organomegally, no rebound, no guarding Ext:  2 plus pulses, no edema, no cyanosis, no clubbing Skin:  No rashes no nodules Neuro:  CN II through XII intact, motor grossly intact  EKG - nsr  Assess/Plan:  WPW syndrome - she is doing well after EPS/RFA of a manifest right posteroseptal AP. She has no evidence of any residual AP conduction. Sharlot Gowda Nahima Ales,MD

## 2023-03-13 ENCOUNTER — Other Ambulatory Visit: Payer: Self-pay | Admitting: Obstetrics and Gynecology

## 2023-03-13 DIAGNOSIS — Z1231 Encounter for screening mammogram for malignant neoplasm of breast: Secondary | ICD-10-CM

## 2023-03-16 DIAGNOSIS — K50113 Crohn's disease of large intestine with fistula: Secondary | ICD-10-CM | POA: Diagnosis not present

## 2023-04-23 DIAGNOSIS — K50119 Crohn's disease of large intestine with unspecified complications: Secondary | ICD-10-CM | POA: Diagnosis not present

## 2023-04-26 DIAGNOSIS — K603 Anal fistula, unspecified: Secondary | ICD-10-CM | POA: Diagnosis not present

## 2023-04-26 DIAGNOSIS — K50113 Crohn's disease of large intestine with fistula: Secondary | ICD-10-CM | POA: Diagnosis not present

## 2023-04-26 DIAGNOSIS — K50119 Crohn's disease of large intestine with unspecified complications: Secondary | ICD-10-CM | POA: Diagnosis not present

## 2023-06-04 DIAGNOSIS — K50119 Crohn's disease of large intestine with unspecified complications: Secondary | ICD-10-CM | POA: Diagnosis not present
